# Patient Record
Sex: Female | Born: 1970 | Race: White | Hispanic: No | Marital: Married | Smoking: Former smoker
Health system: Southern US, Community
[De-identification: ages and names within clinical notes are randomized; demographics above are authoritative.]

## PROBLEM LIST (undated history)

## (undated) DIAGNOSIS — G43909 Migraine, unspecified, not intractable, without status migrainosus: Secondary | ICD-10-CM

## (undated) DIAGNOSIS — K219 Gastro-esophageal reflux disease without esophagitis: Secondary | ICD-10-CM

## (undated) DIAGNOSIS — I4891 Unspecified atrial fibrillation: Secondary | ICD-10-CM

## (undated) HISTORY — DX: Unspecified atrial fibrillation: I48.91

## (undated) HISTORY — PX: TONSILLECTOMY: SUR1361

## (undated) HISTORY — PX: CHOLECYSTECTOMY: SHX55

## (undated) HISTORY — PX: SPERMATIC VEIN LIGATION: SHX5144

---

## 2005-10-01 HISTORY — PX: CHOLECYSTECTOMY: SHX55

## 2013-04-30 ENCOUNTER — Encounter (HOSPITAL_BASED_OUTPATIENT_CLINIC_OR_DEPARTMENT_OTHER): Payer: Self-pay | Admitting: *Deleted

## 2013-04-30 ENCOUNTER — Emergency Department (HOSPITAL_BASED_OUTPATIENT_CLINIC_OR_DEPARTMENT_OTHER)
Admission: EM | Admit: 2013-04-30 | Discharge: 2013-04-30 | Disposition: A | Discharge status: Home / Self Care | Payer: BC Managed Care – PPO | Attending: Emergency Medicine | Admitting: Emergency Medicine

## 2013-04-30 DIAGNOSIS — Z880 Allergy status to penicillin: Secondary | ICD-10-CM | POA: Insufficient documentation

## 2013-04-30 DIAGNOSIS — G43909 Migraine, unspecified, not intractable, without status migrainosus: Secondary | ICD-10-CM | POA: Insufficient documentation

## 2013-04-30 DIAGNOSIS — H53149 Visual discomfort, unspecified: Secondary | ICD-10-CM | POA: Insufficient documentation

## 2013-04-30 DIAGNOSIS — Z87891 Personal history of nicotine dependence: Secondary | ICD-10-CM | POA: Insufficient documentation

## 2013-04-30 DIAGNOSIS — Z79899 Other long term (current) drug therapy: Secondary | ICD-10-CM | POA: Insufficient documentation

## 2013-04-30 DIAGNOSIS — R112 Nausea with vomiting, unspecified: Secondary | ICD-10-CM | POA: Insufficient documentation

## 2013-04-30 DIAGNOSIS — K219 Gastro-esophageal reflux disease without esophagitis: Secondary | ICD-10-CM | POA: Insufficient documentation

## 2013-04-30 HISTORY — DX: Gastro-esophageal reflux disease without esophagitis: K21.9

## 2013-04-30 HISTORY — DX: Migraine, unspecified, not intractable, without status migrainosus: G43.909

## 2013-04-30 MED ORDER — SODIUM CHLORIDE 0.9 % IV BOLUS (SEPSIS)
1000.0000 mL | Freq: Once | INTRAVENOUS | Status: AC
  Administered 2013-04-30: 1000 mL via INTRAVENOUS

## 2013-04-30 MED ORDER — DIPHENHYDRAMINE HCL 50 MG/ML IJ SOLN
25.0000 mg | Freq: Once | INTRAMUSCULAR | Status: AC
  Administered 2013-04-30: 25 mg via INTRAVENOUS
  Filled 2013-04-30: qty 1

## 2013-04-30 MED ORDER — METOCLOPRAMIDE HCL 5 MG/ML IJ SOLN: 10.0000 mg | Freq: Once | INTRAMUSCULAR | Status: DC

## 2013-04-30 MED ORDER — METOCLOPRAMIDE HCL 5 MG/ML IJ SOLN
10.0000 mg | Freq: Once | INTRAMUSCULAR | Status: AC
  Administered 2013-04-30: 10 mg via INTRAVENOUS
  Filled 2013-04-30: qty 2

## 2013-04-30 MED ORDER — ONDANSETRON HCL 4 MG/2ML IJ SOLN
4.0000 mg | Freq: Once | INTRAMUSCULAR | Status: AC
  Administered 2013-04-30: 4 mg via INTRAVENOUS
  Filled 2013-04-30: qty 2

## 2013-04-30 MED ORDER — KETOROLAC TROMETHAMINE 30 MG/ML IJ SOLN
30.0000 mg | Freq: Once | INTRAMUSCULAR | Status: AC
  Administered 2013-04-30: 30 mg via INTRAVENOUS
  Filled 2013-04-30: qty 1

## 2013-04-30 MED ORDER — DEXAMETHASONE SODIUM PHOSPHATE 10 MG/ML IJ SOLN
10.0000 mg | Freq: Once | INTRAMUSCULAR | Status: AC
  Administered 2013-04-30: 10 mg via INTRAVENOUS
  Filled 2013-04-30: qty 1

## 2013-04-30 NOTE — ED Provider Notes (Signed)
CSN: [id number]     Arrival date & time 04/30/13  1247 History     First MD Initiated Contact with Patient 04/30/13 1256     Chief Complaint  Patient presents with  . Migraine   (Consider location/radiation/quality/duration/timing/severity/associated sxs/prior Treatment) HPI Comments: Patient is a 42 year old female with a past medical history of migraines who presents with a headache since 0400am. Patient reports a gradual onset and progressive worsening of the headache. The pain is sharp, constant and is located in generalized head without radiation. Patient has tried Imitrex and phenergan for symptoms without relief. No alleviating/aggravating factors. Patient reports associated nausea, vomiting and photophobia. Patient denies fever, diarrhea, numbness/tingling, weakness, visual changes, congestion, chest pain, SOB, abdominal pain.     Patient is a 42 y.o. female presenting with migraines.  Migraine Associated symptoms include headaches, nausea and vomiting.    Past Medical History  Diagnosis Date  . Migraine   . Gastroesophageal reflux    Past Surgical History  Procedure Laterality Date  . Cholecystectomy    . Tonsillectomy    . Spermatic vein ligation     No family history on file. History  Substance Use Topics  . Smoking status: Former Games developer  . Smokeless tobacco: Not on file  . Alcohol Use: No   OB History   Grav Para Term Preterm Abortions TAB SAB Ect Mult Living                 Review of Systems  Gastrointestinal: Positive for nausea and vomiting.  Neurological: Positive for headaches.  All other systems reviewed and are negative.    Allergies  Penicillins  Home Medications   Current Outpatient Rx  Name  Route  Sig  Dispense  Refill  . omeprazole (PRILOSEC) 40 MG capsule   Oral   Take 40 mg by mouth daily.         . SUMAtriptan (IMITREX) 5 MG/ACT nasal spray   Nasal   Place 1 spray into the nose every 2 (two) hours as needed for migraine.           BP 131/64  Pulse 108  Resp 18  Ht 5\' 7"  (1.702 m)  Wt 250 lb (113.399 kg)  BMI 39.15 kg/m2  SpO2 100%  LMP 04/16/2013 Physical Exam  Nursing note and vitals reviewed. Constitutional: She appears well-developed and well-nourished. No distress.  HENT:  Head: Normocephalic and atraumatic.  Eyes: Conjunctivae and EOM are normal.  Neck: Normal range of motion. Neck supple.  No meningeal signs.   Cardiovascular: Normal rate and regular rhythm.  Exam reveals no gallop and no friction rub.   No murmur heard. Pulmonary/Chest: Effort normal and breath sounds normal. She has no wheezes. She has no rales. She exhibits no tenderness.  Abdominal: Soft. She exhibits no distension. There is no tenderness. There is no rebound.  Musculoskeletal: Normal range of motion.  Neurological: She is alert. Coordination normal.  Speech is goal-oriented. Moves limbs without ataxia.   Skin: Skin is warm and dry.  Psychiatric: She has a normal mood and affect. Her behavior is normal.    ED Course   Procedures (including critical care time)  Labs Reviewed - No data to display No results found.  1. Migraine     MDM  1:48 PM Patient will have fluids, toradol, reglan, benadryl. Vitals stable and patient afebrile. No meningeal signs.   4:18 PM Patient reports relief after another dose of benadryl, zofran and decadron. Vitals stable and  patient afebrile. Patient will have a Neurology follow up as needed. Patient instructed to return with worsening or concerning symptoms.   Emilia Beck, PA-C 04/30/13 1623

## 2013-04-30 NOTE — ED Notes (Signed)
Patient states she woke up this morning with a migraine headache at 0400 am.  Has used her Imitrex nasal spray with no relief.  States had nausea and vomiting x 15 times since 0400.  Took a phenergan suppository at 0915 am with no relief.  Hx of migraines and this one feels like her normal migraine.

## 2013-05-01 NOTE — ED Provider Notes (Signed)
  Medical screening examination/treatment/procedure(s) were performed by non-physician practitioner and as supervising physician I was immediately available for consultation/collaboration.    Annie Saephan, MD 05/01/13 1523 

## 2014-09-25 ENCOUNTER — Emergency Department (HOSPITAL_BASED_OUTPATIENT_CLINIC_OR_DEPARTMENT_OTHER)
Admission: EM | Admit: 2014-09-25 | Discharge: 2014-09-25 | Disposition: A | Discharge status: Home / Self Care | Payer: BC Managed Care – PPO | Attending: Emergency Medicine | Admitting: Emergency Medicine

## 2014-09-25 ENCOUNTER — Encounter (HOSPITAL_BASED_OUTPATIENT_CLINIC_OR_DEPARTMENT_OTHER): Payer: Self-pay | Admitting: Emergency Medicine

## 2014-09-25 DIAGNOSIS — Z880 Allergy status to penicillin: Secondary | ICD-10-CM | POA: Insufficient documentation

## 2014-09-25 DIAGNOSIS — R51 Headache: Secondary | ICD-10-CM | POA: Diagnosis present

## 2014-09-25 DIAGNOSIS — G43809 Other migraine, not intractable, without status migrainosus: Secondary | ICD-10-CM | POA: Diagnosis not present

## 2014-09-25 DIAGNOSIS — Z79899 Other long term (current) drug therapy: Secondary | ICD-10-CM | POA: Diagnosis not present

## 2014-09-25 DIAGNOSIS — K219 Gastro-esophageal reflux disease without esophagitis: Secondary | ICD-10-CM | POA: Insufficient documentation

## 2014-09-25 DIAGNOSIS — Z87891 Personal history of nicotine dependence: Secondary | ICD-10-CM | POA: Insufficient documentation

## 2014-09-25 MED ORDER — METOCLOPRAMIDE HCL 5 MG/ML IJ SOLN
10.0000 mg | Freq: Once | INTRAMUSCULAR | Status: AC
  Administered 2014-09-25: 10 mg via INTRAVENOUS
  Filled 2014-09-25: qty 2

## 2014-09-25 MED ORDER — SODIUM CHLORIDE 0.9 % IV BOLUS (SEPSIS)
1000.0000 mL | Freq: Once | INTRAVENOUS | Status: AC
  Administered 2014-09-25: 1000 mL via INTRAVENOUS

## 2014-09-25 MED ORDER — KETOROLAC TROMETHAMINE 30 MG/ML IJ SOLN
30.0000 mg | Freq: Once | INTRAMUSCULAR | Status: AC
  Administered 2014-09-25: 30 mg via INTRAVENOUS
  Filled 2014-09-25: qty 1

## 2014-09-25 MED ORDER — DIPHENHYDRAMINE HCL 50 MG/ML IJ SOLN
25.0000 mg | Freq: Once | INTRAMUSCULAR | Status: AC
  Administered 2014-09-25: 25 mg via INTRAVENOUS
  Filled 2014-09-25: qty 1

## 2014-09-25 MED ORDER — ONDANSETRON 8 MG PO TBDP
8.0000 mg | ORAL_TABLET | Freq: Once | ORAL | Status: AC
  Administered 2014-09-25: 8 mg via ORAL
  Filled 2014-09-25: qty 1

## 2014-09-25 MED ORDER — DEXAMETHASONE SODIUM PHOSPHATE 10 MG/ML IJ SOLN
10.0000 mg | Freq: Once | INTRAMUSCULAR | Status: AC
Start: 2014-09-25 — End: 2014-09-25
  Administered 2014-09-25: 10 mg via INTRAVENOUS
  Filled 2014-09-25: qty 1

## 2014-09-25 NOTE — ED Provider Notes (Signed)
CSN: [id number]     Arrival date & time 09/25/14  1723 History  This chart was scribed for Geoffery Lyonsouglas Kameka Whan, MD by Modena JanskyAlbert Thayil, ED Scribe. This patient was seen in room MH05/MH05 and the patient's care was started at 7:10 PM.   Chief Complaint  Patient presents with  . Migraine  . Emesis   Patient is a 43 y.o. female presenting with migraines and vomiting. The history is provided by the patient. No language interpreter was used.  Migraine This is a chronic problem. The current episode started 6 to 12 hours ago. The problem occurs constantly. The problem has not changed since onset.Associated symptoms include headaches. Nothing aggravates the symptoms. Nothing relieves the symptoms. She has tried nothing for the symptoms.  Emesis Associated symptoms: headaches    HPI Comments: Juanda CrumbleJennifer Feldhaus is a 43 y.o. female with a hx of migraines who presents to the Emergency Department complaining of a constant moderate migraine that started today. She reports that the migraine is right sided and radiates to her neck. She states that she took zomig without any relief. She states that she has been having emesis today.  Past Medical History  Diagnosis Date  . Migraine   . Gastroesophageal reflux    Past Surgical History  Procedure Laterality Date  . Cholecystectomy    . Tonsillectomy    . Spermatic vein ligation     No family history on file. History  Substance Use Topics  . Smoking status: Former Games developermoker  . Smokeless tobacco: Not on file  . Alcohol Use: No   OB History    No data available     Review of Systems  Gastrointestinal: Positive for vomiting.  Neurological: Positive for headaches.  All other systems reviewed and are negative.     Allergies  Penicillins  Home Medications   Prior to Admission medications   Medication Sig Start Date End Date Taking? Authorizing Provider  zolmitriptan (ZOMIG) 5 MG nasal solution Place into the nose as needed for migraine.   Yes Historical  Provider, MD  omeprazole (PRILOSEC) 40 MG capsule Take 40 mg by mouth daily.    Historical Provider, MD  SUMAtriptan (IMITREX) 5 MG/ACT nasal spray Place 1 spray into the nose every 2 (two) hours as needed for migraine.    Historical Provider, MD   BP 121/77 mmHg  Pulse 93  Temp(Src) 98.3 F (36.8 C) (Oral)  Resp 18  Ht 5\' 7"  (1.702 m)  Wt 250 lb (113.399 kg)  BMI 39.15 kg/m2  SpO2 99%  LMP 09/25/2014 Physical Exam  Constitutional: She is oriented to person, place, and time. She appears well-developed and well-nourished. No distress.  HENT:  Head: Normocephalic and atraumatic.  Eyes: EOM are normal. Pupils are equal, round, and reactive to light.  NO papilledema on funduscopic exam  Neck: Normal range of motion. Neck supple. No tracheal deviation present.  No nuchal rigidity.   Cardiovascular: Normal rate, regular rhythm and normal heart sounds.  Exam reveals no gallop and no friction rub.   No murmur heard. Pulmonary/Chest: Effort normal. No respiratory distress. She has no wheezes. She has no rales.  Musculoskeletal: Normal range of motion.  Neurological: She is alert and oriented to person, place, and time. No cranial nerve deficit. She exhibits normal muscle tone. Coordination normal.  Skin: Skin is warm and dry.  Psychiatric: She has a normal mood and affect. Her behavior is normal.  Nursing note and vitals reviewed.   ED Course  Procedures (including critical  care time) DIAGNOSTIC STUDIES: Oxygen Saturation is 99% on RA, normal by my interpretation.    COORDINATION OF CARE: 7:14 PM- Pt advised of plan for treatment which includes medication and pt agrees.  Labs Review Labs Reviewed - No data to display  Imaging Review No results found.   EKG Interpretation None      MDM   Final diagnoses:  None    Patient with history of migraine headaches. She presents today with complaints of headache that is similar to prior migraines, however is not relieved with  home medications. She is given IV fluids and a migraine cocktail and is now feeling significantly improved. She will be discharged to home, to return as needed for any problems.  I personally performed the services described in this documentation, which was scribed in my presence. The recorded information has been reviewed and is accurate.       Geoffery Lyonsouglas Cadey Bazile, MD 09/26/14 1800

## 2014-09-25 NOTE — ED Notes (Addendum)
Pt presents to ED with complaints of migraine and vomiting. PT took migraine meds at home without relief. PT vomiting in triage.

## 2014-09-25 NOTE — Discharge Instructions (Signed)

## 2016-06-07 ENCOUNTER — Other Ambulatory Visit: Payer: Self-pay | Admitting: *Deleted

## 2016-06-07 NOTE — Telephone Encounter (Signed)
Opened in error

## 2018-07-23 ENCOUNTER — Other Ambulatory Visit: Payer: Self-pay | Admitting: Family Medicine

## 2018-07-23 DIAGNOSIS — E041 Nontoxic single thyroid nodule: Secondary | ICD-10-CM

## 2018-08-01 ENCOUNTER — Ambulatory Visit
Admission: RE | Admit: 2018-08-01 | Discharge: 2018-08-01 | Disposition: A | Discharge status: Home / Self Care | Payer: Self-pay | Source: Ambulatory Visit | Attending: Family Medicine | Admitting: Family Medicine

## 2018-08-01 DIAGNOSIS — E041 Nontoxic single thyroid nodule: Secondary | ICD-10-CM

## 2019-02-06 IMAGING — US US THYROID
1 series · 13 of 25 positions shown · non-contrast
Comparison: None.

CLINICAL DATA: Palpable abnormality.  Thyroid nodule.

EXAM:
THYROID ULTRASOUND
TECHNIQUE: Ultrasound examination of the thyroid gland and adjacent soft
tissues was performed.

[Series 1: us thyroid · 0.06mm/px · 13 of 45 slices shown]
[im 1/45]
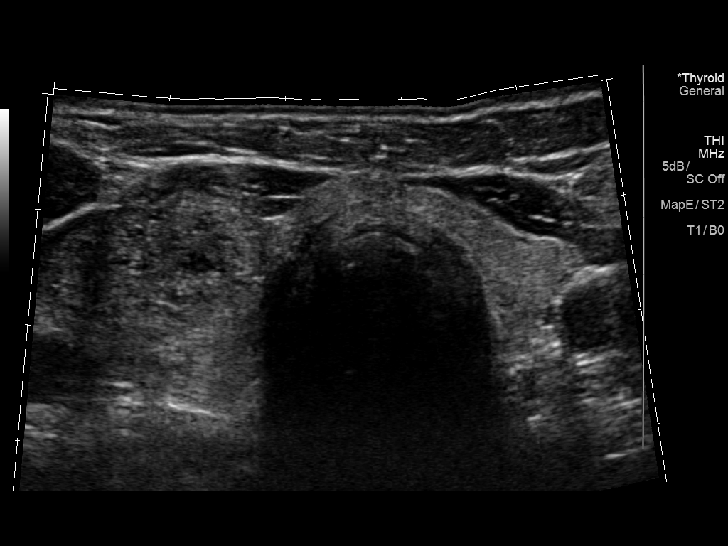
[im 4/45]
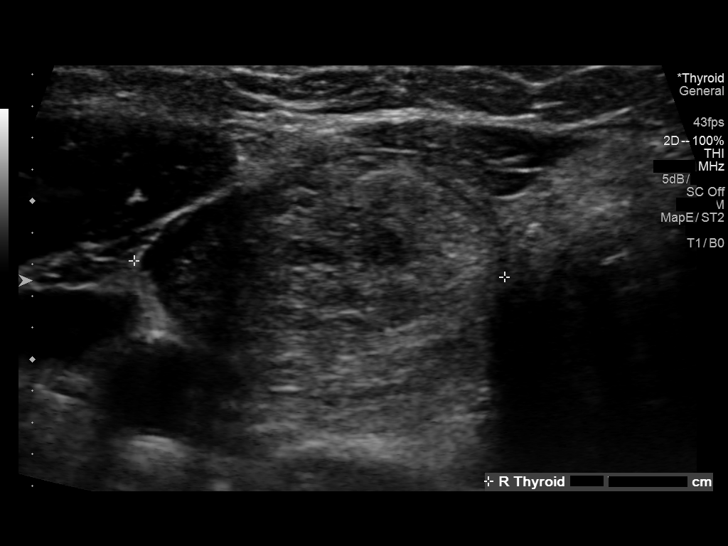
[im 8/45]
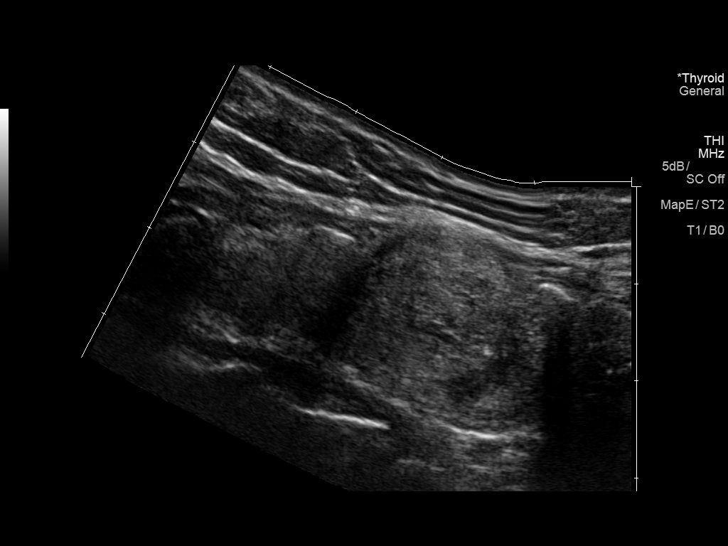
[im 12/45]
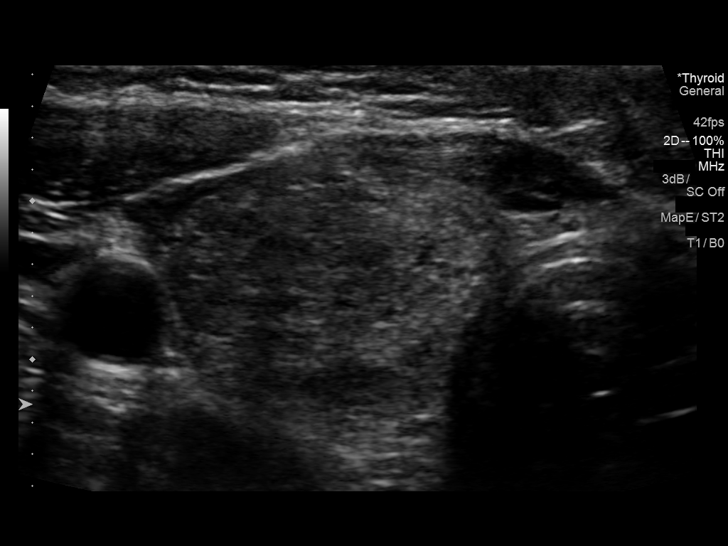
[im 15/45]
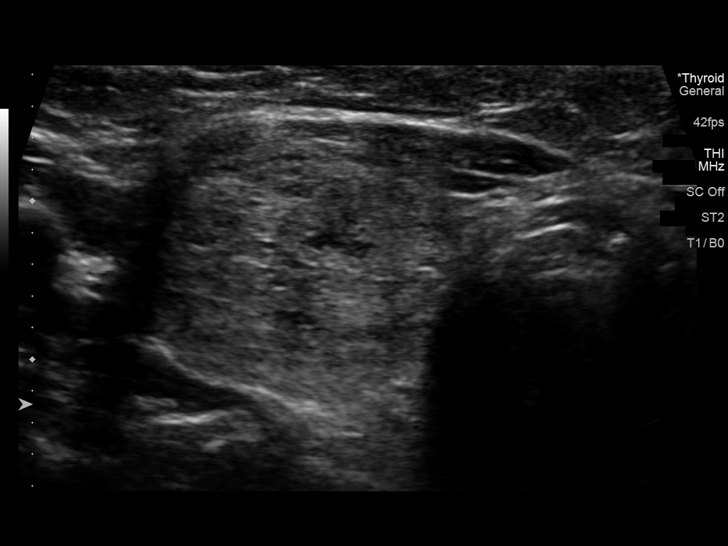
[im 19/45]
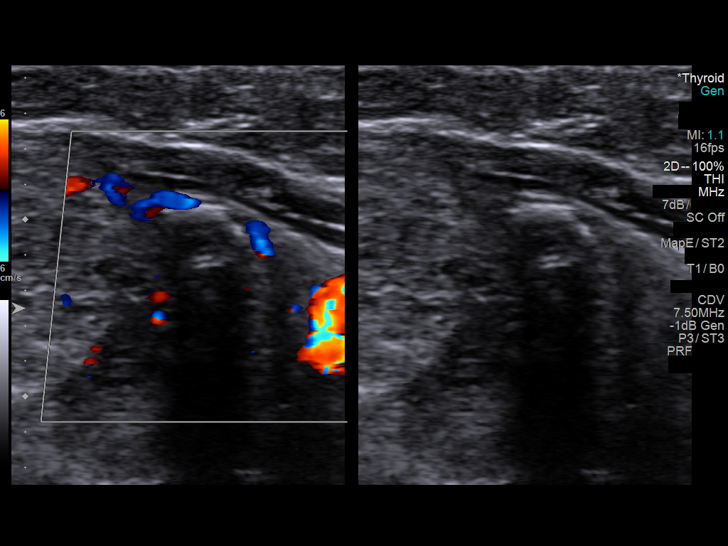
[im 23/45]
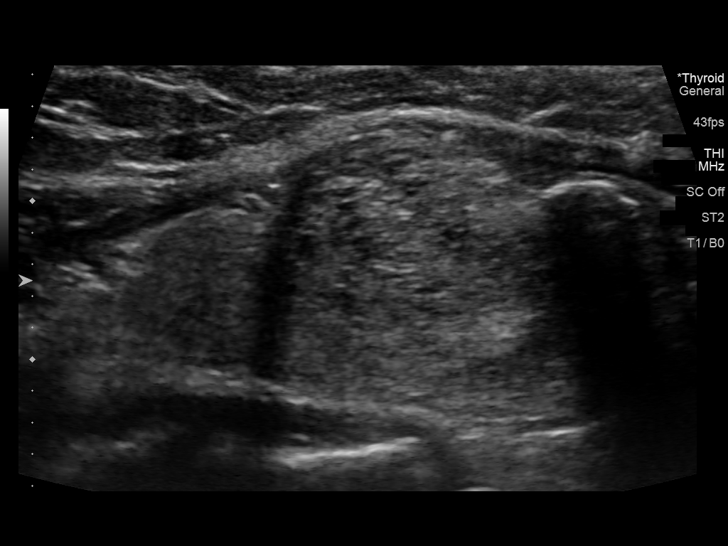
[im 26/45]
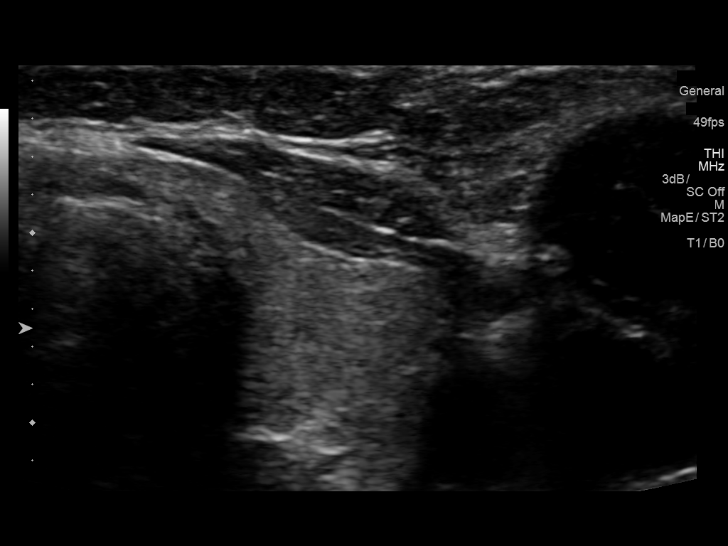
[im 30/45]
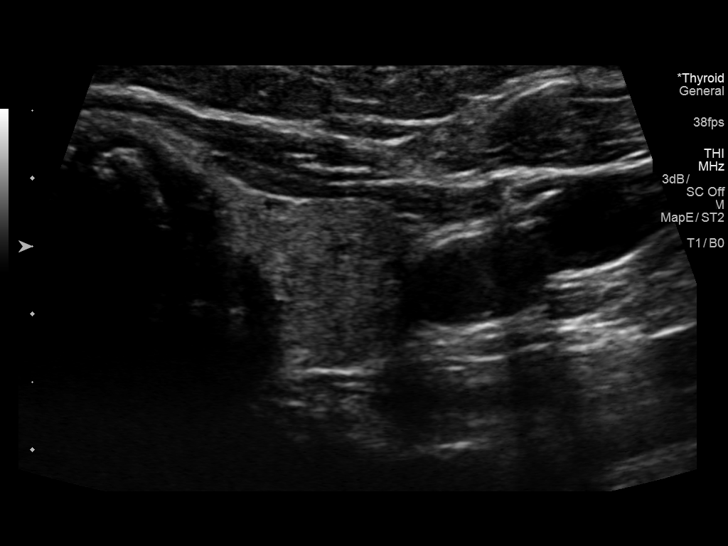
[im 34/45]
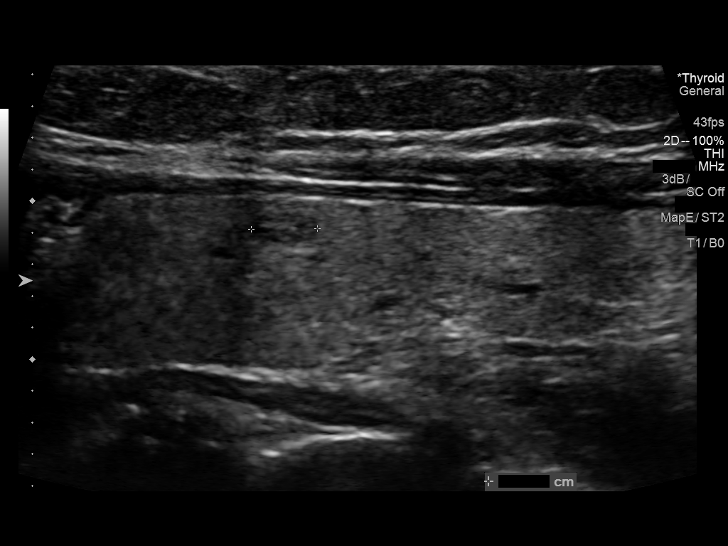
[im 37/45]
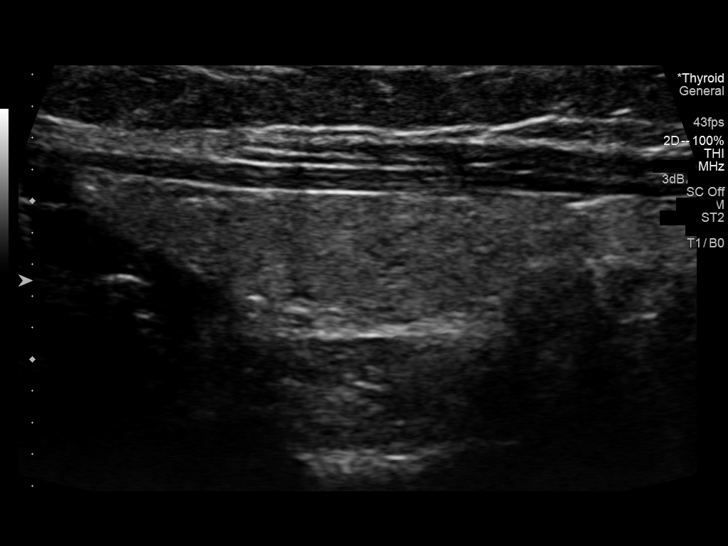
[im 41/45]
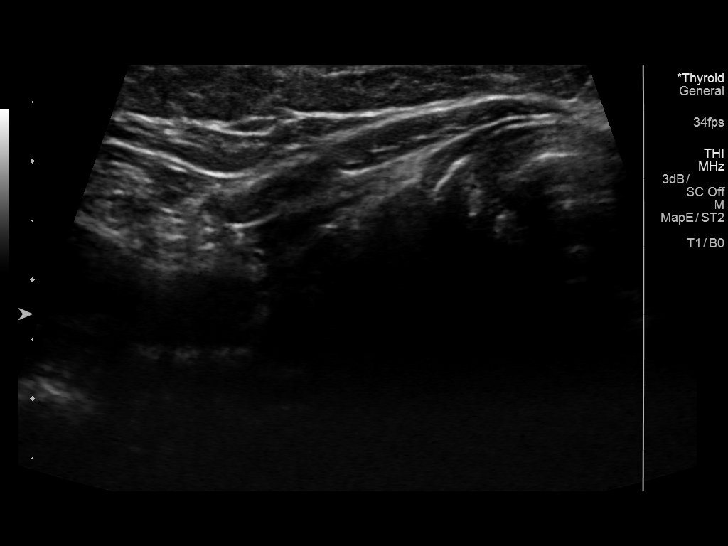
[im 45/45]
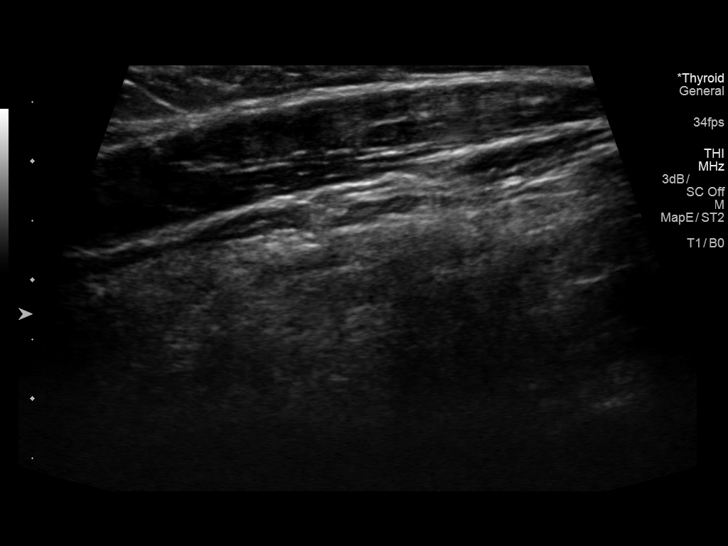

[13 of 25 positions shown; findings below may reference images not displayed]

FINDINGS: Parenchymal Echotexture: Normal

Isthmus: 0.2 cm

Right lobe: 4.6 x 2.0 x 2.3 cm

Left lobe: 4.3 x 1.3 x 1.0 cm

_________________________________________________________

Estimated total number of nodules >/= 1 cm: 1

Number of spongiform nodules >/=  2 cm not described below (TR1): 0

Number of mixed cystic and solid nodules >/= 1.5 cm not described
below (TR2): 0

_________________________________________________________

Nodule # 1:

Location: Right; Mid

Maximum size: 2.3 cm; Other 2 dimensions: 2.1 x 1.6 cm

Composition: solid/almost completely solid (2)

Echogenicity: isoechoic (1)

Shape: not taller-than-wide (0)

Margins: smooth (0)

Echogenic foci: none (0)

ACR TI-RADS total points: 3.

ACR TI-RADS risk category: TR3 (3 points).

ACR TI-RADS recommendations:

*Given size (>/= 1.5 - 2.4 cm) and appearance, a follow-up
ultrasound in 1 year should be considered based on TI-RADS criteria.

_________________________________________________________

Nodule # 2:

Location: Right; Inferior

Maximum size: 0.9 cm; Other 2 dimensions: 0.8 x 0.7 cm

Composition: solid/almost completely solid (2)

Echogenicity: isoechoic (1)

Shape: not taller-than-wide (0)

Margins: smooth (0)

Echogenic foci: peripheral calcifications (2)

ACR TI-RADS total points: 5.

ACR TI-RADS risk category: TR4 (4-6 points).

ACR TI-RADS recommendations:

Given size (<0.9 cm) and appearance, this nodule does NOT meet
TI-RADS criteria for biopsy or dedicated follow-up.

_________________________________________________________
IMPRESSION: Right nodule 1 meets criteria for annual follow-up.

The above is in keeping with the ACR TI-RADS recommendations - [HOSPITAL] 8333;[DATE].

## 2019-03-18 ENCOUNTER — Other Ambulatory Visit: Payer: Self-pay | Admitting: Internal Medicine

## 2019-03-18 DIAGNOSIS — E041 Nontoxic single thyroid nodule: Secondary | ICD-10-CM

## 2019-04-09 ENCOUNTER — Ambulatory Visit
Admission: RE | Admit: 2019-04-09 | Discharge: 2019-04-09 | Disposition: A | Discharge status: Home / Self Care | Payer: Self-pay | Source: Ambulatory Visit | Attending: Internal Medicine | Admitting: Internal Medicine

## 2019-04-09 DIAGNOSIS — E041 Nontoxic single thyroid nodule: Secondary | ICD-10-CM

## 2019-06-26 LAB — HM COLONOSCOPY

## 2020-03-18 ENCOUNTER — Other Ambulatory Visit: Payer: Self-pay | Admitting: Internal Medicine

## 2020-03-18 DIAGNOSIS — E041 Nontoxic single thyroid nodule: Secondary | ICD-10-CM

## 2020-03-22 ENCOUNTER — Ambulatory Visit
Admission: RE | Admit: 2020-03-22 | Discharge: 2020-03-22 | Disposition: A | Discharge status: Home / Self Care | Payer: BC Managed Care – PPO | Source: Ambulatory Visit | Attending: Internal Medicine | Admitting: Internal Medicine

## 2020-03-22 DIAGNOSIS — E041 Nontoxic single thyroid nodule: Secondary | ICD-10-CM

## 2021-04-15 IMAGING — US US THYROID
1 series · 13 of 25 positions shown · non-contrast
Comparison: 08/01/2018

CLINICAL DATA: Prior ultrasound follow-up. Follow-up thyroid nodule

EXAM:
THYROID ULTRASOUND
TECHNIQUE: Ultrasound examination of the thyroid gland and adjacent soft
tissues was performed.

[Series 1: us thyroid · 0.05mm/px · 13 of 41 slices shown]
[im 1/41]
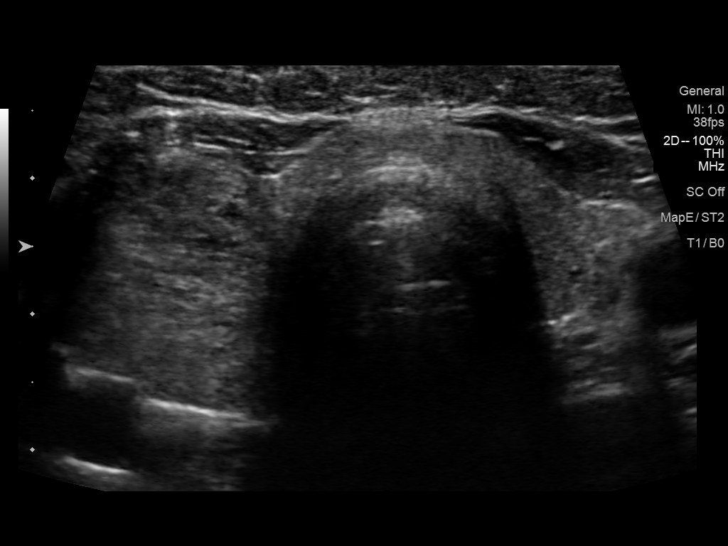
[im 4/41]
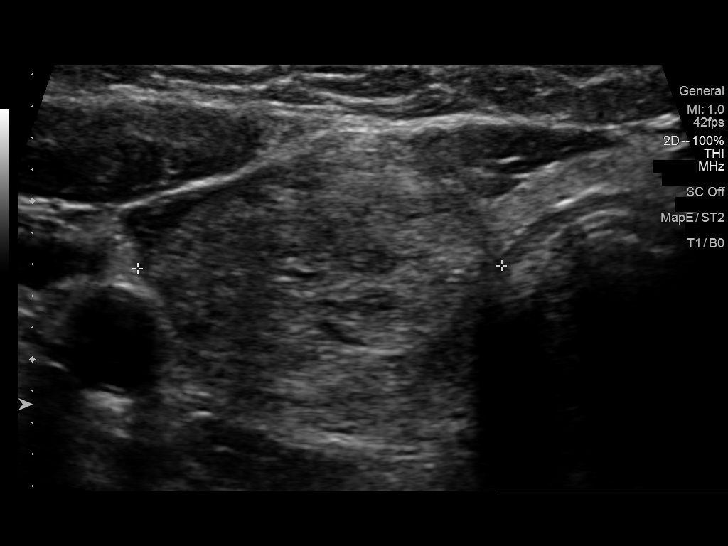
[im 7/41]
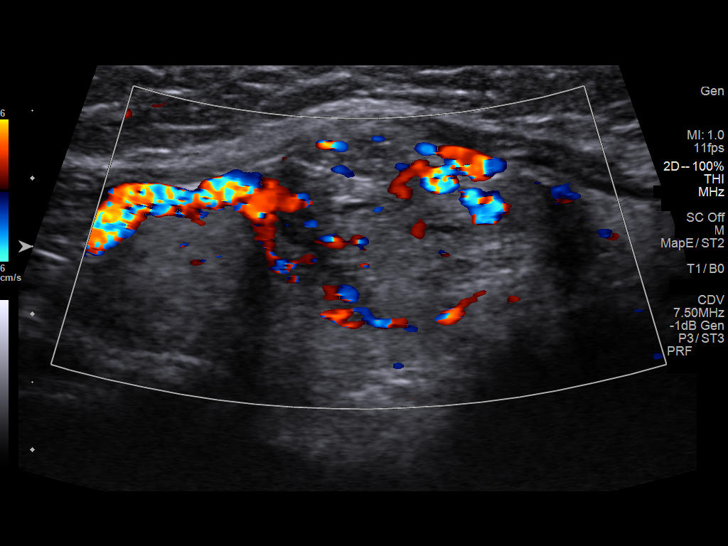
[im 11/41]
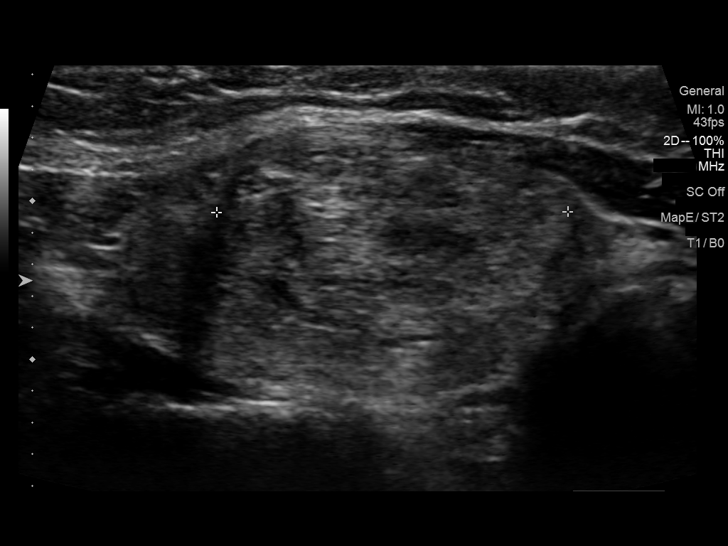
[im 14/41]
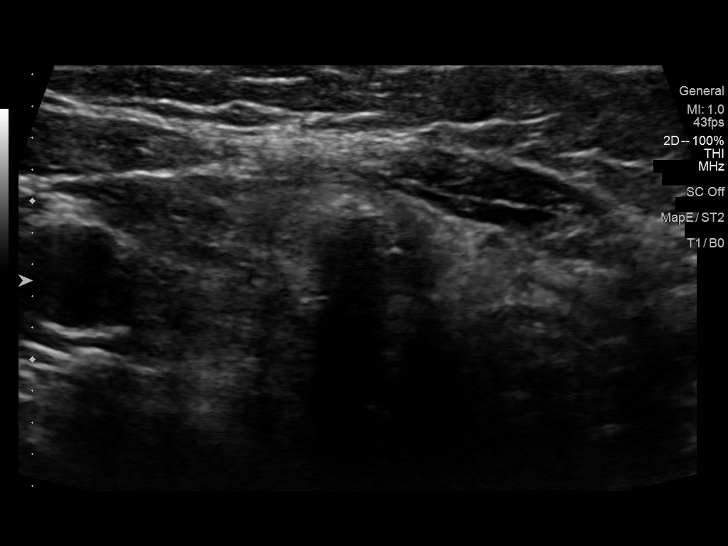
[im 17/41]
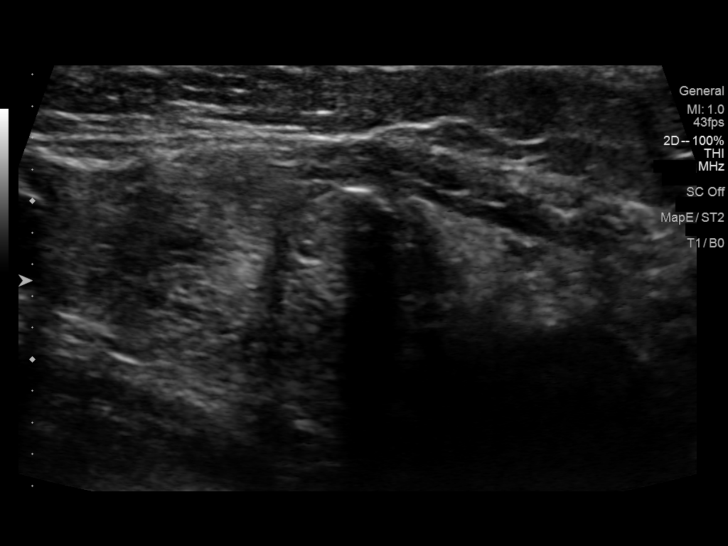
[im 21/41]
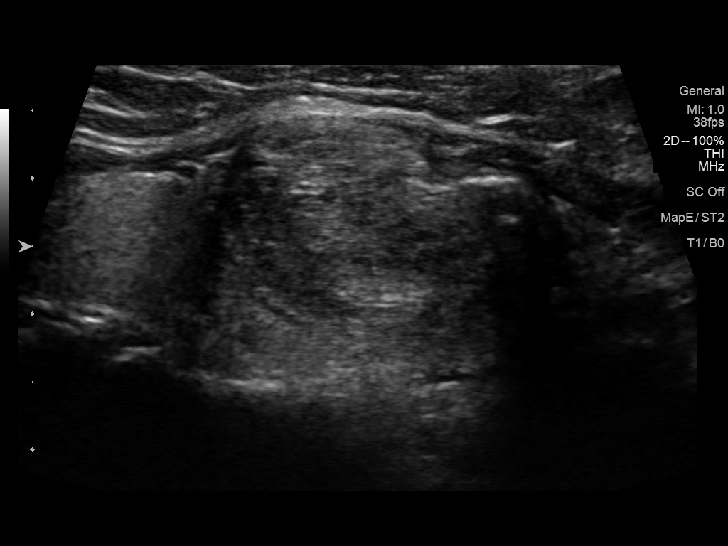
[im 24/41]
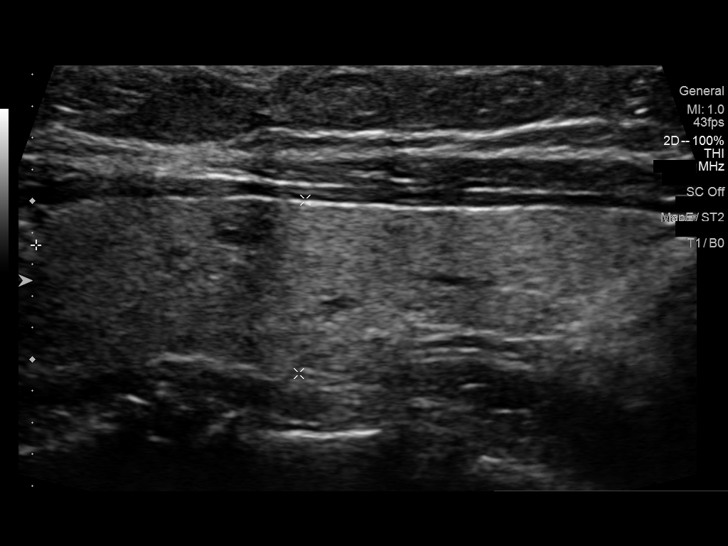
[im 27/41]
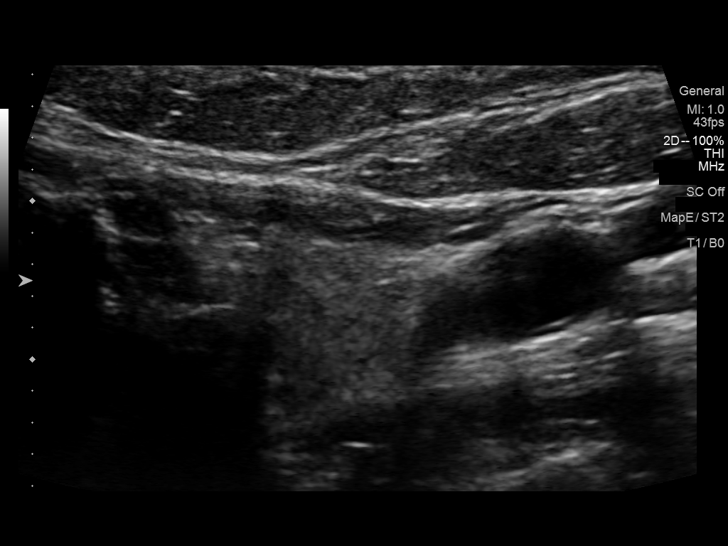
[im 31/41]
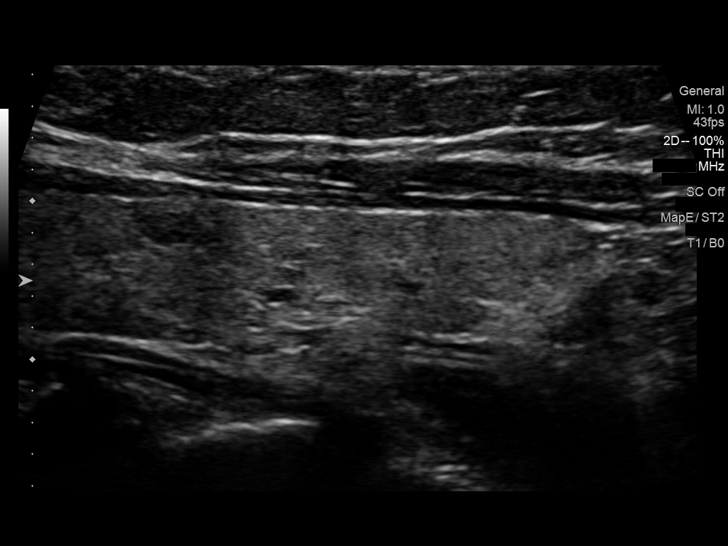
[im 34/41]
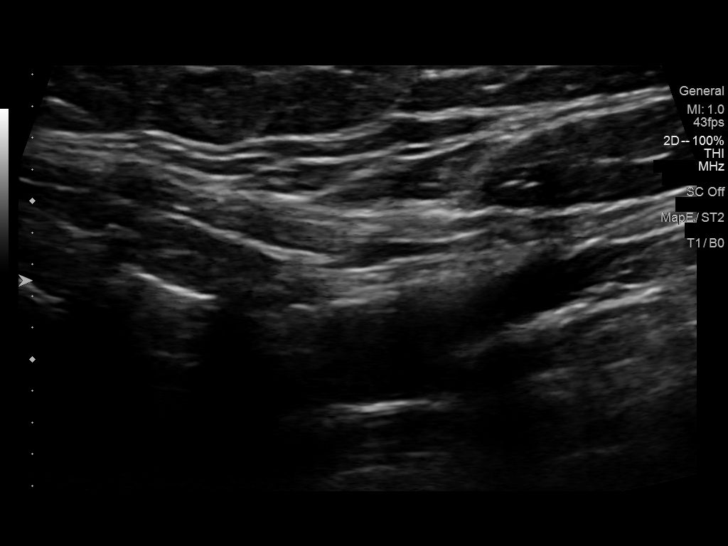
[im 37/41]
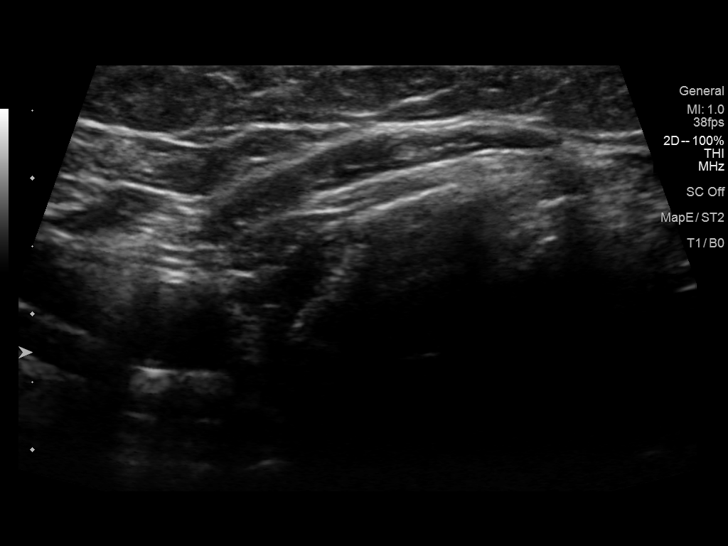
[im 41/41]
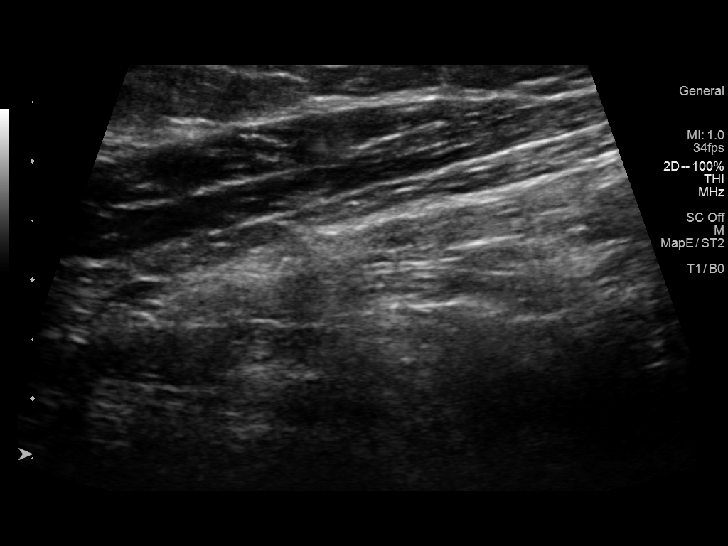

[13 of 25 positions shown; findings below may reference images not displayed]

FINDINGS: Parenchymal Echotexture: Mildly heterogenous

Isthmus: Normal in size measures 0.3 cm in diameter, unchanged

Right lobe: Normal in size measuring 4.6 x 2.0 x 2.3 cm, unchanged,
previously, 4.6 x 2.0 x 2.3 cm

Left lobe: Normal in size measuring 4.1 x 1.1 x 1.0 cm, unchanged,
previously, 4.3 x 1.3 x 1.0 cm

_________________________________________________________

Estimated total number of nodules >/= 1 cm: 1

Number of spongiform nodules >/=  2 cm not described below (TR1): 0

Number of mixed cystic and solid nodules >/= 1.5 cm not described
below (TR2): 0

_________________________________________________________

Nodule # 1:

Prior biopsy: No

Location: Right; Mid

Maximum size: 2.2 cm; Other 2 dimensions: 2.1 x 1.7 cm, previously,
2.3 x 2.1 x 1.6 cm

Composition: solid/almost completely solid (2)

Echogenicity: isoechoic (1)

Shape: not taller-than-wide (0)

Margins: ill-defined (0)

Echogenic foci: none (0)

ACR TI-RADS total points: 3.

ACR TI-RADS risk category:  TR3 (3 points).

Significant change in size (>/= 20% in two dimensions and minimal
increase of 2 mm): No

Change in features: No

Change in ACR TI-RADS risk category: No

ACR TI-RADS recommendations:

*Given size (>/= 1.5 - 2.4 cm) and appearance, a follow-up
ultrasound in 1 year should be considered based on TI-RADS criteria.

_________________________________________________________

Nodule # 2:

Prior biopsy: No

Location: Right; Inferior

Maximum size: 0.9 cm; Other 2 dimensions: 0.9 x 0.8 cm, previously,
0.9 x 0.8 x 0.7 cm

Composition: cannot determine (2)

Echogenicity: cannot determine (1)

Shape: not taller-than-wide (0)

Margins: ill-defined (0)

Echogenic foci: peripheral calcifications (2)

ACR TI-RADS total points: 5.

ACR TI-RADS risk category:  TR4 (4-6 points).

Significant change in size (>/= 20% in two dimensions and minimal
increase of 2 mm): No

Change in features: No

Change in ACR TI-RADS risk category: No

ACR TI-RADS recommendations:

Given size (<0.9 cm) and appearance, this nodule does NOT meet
TI-RADS criteria for biopsy or dedicated follow-up.

_________________________________________________________
IMPRESSION: 1. Similar findings of multinodular goiter. No new or enlarging
thyroid nodules.
2. Nodule #1 within the right lobe of the thyroid is unchanged
compared to the [DATE] examination though again meets imaging
criteria to recommend 1 year follow-up as clinically indicated.
Annual surveillance until [DATE] would ensure 5 years of stability
and thus a benign etiology.

The above is in keeping with the ACR TI-RADS recommendations - [HOSPITAL] 1937;[DATE].

## 2021-09-21 ENCOUNTER — Telehealth: Payer: Self-pay | Admitting: Cardiovascular Disease

## 2021-09-21 ENCOUNTER — Encounter (HOSPITAL_BASED_OUTPATIENT_CLINIC_OR_DEPARTMENT_OTHER): Payer: Self-pay | Admitting: *Deleted

## 2021-09-21 ENCOUNTER — Emergency Department (HOSPITAL_BASED_OUTPATIENT_CLINIC_OR_DEPARTMENT_OTHER)
Admission: EM | Admit: 2021-09-21 | Discharge: 2021-09-21 | Disposition: A | Discharge status: Home / Self Care | Payer: 59 | Attending: Emergency Medicine | Admitting: Emergency Medicine

## 2021-09-21 ENCOUNTER — Emergency Department (HOSPITAL_BASED_OUTPATIENT_CLINIC_OR_DEPARTMENT_OTHER): Payer: 59

## 2021-09-21 ENCOUNTER — Other Ambulatory Visit: Payer: Self-pay

## 2021-09-21 DIAGNOSIS — I4892 Unspecified atrial flutter: Secondary | ICD-10-CM | POA: Diagnosis not present

## 2021-09-21 DIAGNOSIS — Z87891 Personal history of nicotine dependence: Secondary | ICD-10-CM | POA: Diagnosis not present

## 2021-09-21 DIAGNOSIS — R002 Palpitations: Secondary | ICD-10-CM | POA: Diagnosis present

## 2021-09-21 DIAGNOSIS — I4891 Unspecified atrial fibrillation: Secondary | ICD-10-CM

## 2021-09-21 LAB — CBC WITH DIFFERENTIAL/PLATELET
Abs Immature Granulocytes: 0.04 10*3/uL (ref 0.00–0.07)
Basophils Absolute: 0.1 10*3/uL (ref 0.0–0.1)
Basophils Relative: 1 %
Eosinophils Absolute: 0.3 10*3/uL (ref 0.0–0.5)
Eosinophils Relative: 3 %
HCT: 38.1 % (ref 36.0–46.0)
Hemoglobin: 12.3 g/dL (ref 12.0–15.0)
Immature Granulocytes: 0 %
Lymphocytes Relative: 29 %
Lymphs Abs: 3.1 10*3/uL (ref 0.7–4.0)
MCH: 27.2 pg (ref 26.0–34.0)
MCHC: 32.3 g/dL (ref 30.0–36.0)
MCV: 84.1 fL (ref 80.0–100.0)
Monocytes Absolute: 1.3 10*3/uL — ABNORMAL HIGH (ref 0.1–1.0)
Monocytes Relative: 11 %
Neutro Abs: 6.2 10*3/uL (ref 1.7–7.7)
Neutrophils Relative %: 56 %
Platelets: 448 10*3/uL — ABNORMAL HIGH (ref 150–400)
RBC: 4.53 MIL/uL (ref 3.87–5.11)
RDW: 16.5 % — ABNORMAL HIGH (ref 11.5–15.5)
WBC: 10.9 10*3/uL — ABNORMAL HIGH (ref 4.0–10.5)
nRBC: 0 % (ref 0.0–0.2)

## 2021-09-21 LAB — COMPREHENSIVE METABOLIC PANEL
ALT: 20 U/L (ref 0–44)
AST: 22 U/L (ref 15–41)
Albumin: 3.9 g/dL (ref 3.5–5.0)
Alkaline Phosphatase: 71 U/L (ref 38–126)
Anion gap: 8 (ref 5–15)
BUN: 16 mg/dL (ref 6–20)
CO2: 26 mmol/L (ref 22–32)
Calcium: 9.1 mg/dL (ref 8.9–10.3)
Chloride: 103 mmol/L (ref 98–111)
Creatinine, Ser: 0.62 mg/dL (ref 0.44–1.00)
GFR, Estimated: >60 mL/min (ref >60)
Glucose, Bld: 110 mg/dL — ABNORMAL HIGH (ref 70–99)
Potassium: 3.8 mmol/L (ref 3.5–5.1)
Sodium: 137 mmol/L (ref 135–145)
Total Bilirubin: 0.1 mg/dL — ABNORMAL LOW (ref 0.3–1.2)
Total Protein: 7.7 g/dL (ref 6.5–8.1)

## 2021-09-21 LAB — TROPONIN I (HIGH SENSITIVITY): Troponin I (High Sensitivity): 2 ng/L (ref <18)

## 2021-09-21 MED ORDER — DILTIAZEM LOAD VIA INFUSION
10.0000 mg | Freq: Once | INTRAVENOUS | Status: AC
  Administered 2021-09-21: 01:00:00 10 mg via INTRAVENOUS
  Filled 2021-09-21: qty 10

## 2021-09-21 MED ORDER — PROPOFOL 10 MG/ML IV BOLUS
0.5000 mg/kg | Freq: Once | INTRAVENOUS | Status: AC
  Administered 2021-09-21: 04:00:00 60.1 mg via INTRAVENOUS
  Filled 2021-09-21: qty 20

## 2021-09-21 MED ORDER — DILTIAZEM HCL-DEXTROSE 125-5 MG/125ML-% IV SOLN (PREMIX)
5.0000 mg/h | INTRAVENOUS | Status: DC
  Administered 2021-09-21: 01:00:00 5 mg/h via INTRAVENOUS
  Filled 2021-09-21: qty 125

## 2021-09-21 MED ORDER — PROPOFOL 10 MG/ML IV BOLUS
INTRAVENOUS | Status: AC | PRN
  Administered 2021-09-21: 30 mg via INTRAVENOUS
  Administered 2021-09-21 (×3): 40 mg via INTRAVENOUS

## 2021-09-21 NOTE — Telephone Encounter (Signed)
Patient was in the emergency room for new onset A-Fib I scheduled her for new patient appt on 1/23, as that was the first available I could find.  She would like to be seen sooner as the ER told her to be seen by [place] next week.

## 2021-09-21 NOTE — ED Provider Notes (Signed)
[facility] EMERGENCY DEPARTMENT Provider Note   CSN: [id number] Arrival date & time: 09/21/21  0011     History Chief Complaint  Patient presents with   Palpitations    Angel Durham is a 50 y.o. female.  Patient is a 50 year old female with history of GERD and migraines.  She presents today for evaluation of palpitations and weakness.  This started approximately 30 minutes prior to presentation.  Patient was asleep at the time, then woke feeling lightheaded and heart racing.  She attempted to walk around the house to get it to go away, however did not.  She denies having any chest pain.  She denies any excessive caffeine or alcohol intake.  She denies any cardiac history.  The history is provided by the patient.  Palpitations Palpitations quality:  Irregular Onset quality:  Sudden Duration:  30 minutes Timing:  Constant Progression:  Unchanged Chronicity:  New Relieved by:  Nothing Worsened by:  Nothing Ineffective treatments:  None tried     Past Medical History:  Diagnosis Date   Gastroesophageal reflux    Migraine     There are no problems to display for this patient.   Past Surgical History:  Procedure Laterality Date   CHOLECYSTECTOMY     SPERMATIC VEIN LIGATION     TONSILLECTOMY       OB History   No obstetric history on file.     No family history on file.  Social History   Tobacco Use   Smoking status: Former  Substance Use Topics   Alcohol use: No   Drug use: No    Home Medications Prior to Admission medications   Medication Sig Start Date End Date Taking? Authorizing Provider  omeprazole (PRILOSEC) 40 MG capsule Take 40 mg by mouth daily.    [provider]  SUMAtriptan (IMITREX) 5 MG/ACT nasal spray Place 1 spray into the nose every 2 (two) hours as needed for migraine.    [provider]  zolmitriptan (ZOMIG) 5 MG nasal solution Place into the nose as needed for migraine.    [provider]     Allergies    Penicillins  Review of Systems   Review of Systems  Cardiovascular:  Positive for palpitations.   Physical Exam Updated Vital Signs BP 126/78    Pulse (!) 113    Temp 98.3 F (36.8 C)    Resp 16    Ht 5\' 6"  (1.676 m)    Wt 120.2 kg    SpO2 100%    BMI 42.77 kg/m   Physical Exam Vitals and nursing note reviewed.  Constitutional:      General: She is not in acute distress.    Appearance: She is well-developed. She is not diaphoretic.  HENT:     Head: Normocephalic and atraumatic.  Cardiovascular:     Rate and Rhythm: Tachycardia present. Rhythm irregular.     Heart sounds: No murmur heard.   No friction rub. No gallop.  Pulmonary:     Effort: Pulmonary effort is normal. No respiratory distress.     Breath sounds: Normal breath sounds. No wheezing.  Abdominal:     General: Bowel sounds are normal. There is no distension.     Palpations: Abdomen is soft.     Tenderness: There is no abdominal tenderness.  Musculoskeletal:        General: Normal range of motion.     Cervical back: Normal range of motion and neck supple.  Skin:  General: Skin is warm and dry.  Neurological:     General: No focal deficit present.     Mental Status: She is alert and oriented to person, place, and time.     Cranial Nerves: No cranial nerve deficit.    ED Results / Procedures / Treatments   Labs (all labs ordered are listed, but only abnormal results are displayed) Labs Reviewed  CBC WITH DIFFERENTIAL/PLATELET  COMPREHENSIVE METABOLIC PANEL    EKG EKG Interpretation  Date/Time:  Thursday September 21 2021 00:25:49 EST Ventricular Rate:  116 PR Interval:    QRS Duration: 100 QT Interval:  342 QTC Calculation: 476 R Axis:   99 Text Interpretation: Atrial fibrillation Borderline right axis deviation Confirmed by Geoffery Lyons ([id number]) on 09/21/2021 12:34:45 AM  Radiology No results found.  Procedures .Cardioversion  Date/Time: 09/21/2021 4:15 AM Performed by:  Geoffery Lyons, MD Authorized by: Geoffery Lyons, MD   Consent:    Consent obtained:  Verbal and written   Consent given by:  Patient   Alternatives discussed:  No treatment, rate-control medication and anti-coagulation medication Pre-procedure details:    Cardioversion basis:  Emergent   Rhythm:  Atrial fibrillation   Electrode placement:  Anterior-posterior Patient sedated: Yes. Refer to sedation procedure documentation for details of sedation.  Attempt one:    Cardioversion mode:  Synchronous   Waveform:  Biphasic   Shock (Joules):  120   Shock outcome:  Conversion to normal sinus rhythm Post-procedure details:    Patient status:  Alert   Patient tolerance of procedure:  Tolerated well, no immediate complications .Sedation  Date/Time: 09/21/2021 4:16 AM Performed by: Geoffery Lyons, MD Authorized by: Geoffery Lyons, MD   Consent:    Consent obtained:  Verbal and written   Consent given by:  Patient   Risks discussed:  Inadequate sedation, prolonged sedation necessitating reversal and allergic reaction Universal protocol:    Procedure explained and questions answered to patient or proxy's satisfaction: yes     Relevant documents present and verified: yes     Test results available: yes     Imaging studies available: yes     Required blood products, implants, devices, and special equipment available: yes     Immediately prior to procedure, a time out was called: yes     Patient identity confirmed:  Anonymous protocol, patient vented/unresponsive, arm band and verbally with patient Indications:    Procedure performed:  Cardioversion   Procedure necessitating sedation performed by:  Physician performing sedation Pre-sedation assessment:    Time since last food or drink:  6 hours   ASA classification: class 1 - normal, healthy patient     Mallampati score:  I - soft palate, uvula, fauces, pillars visible   Pre-sedation assessments completed and reviewed: airway patency, mental  status and respiratory function   Immediate pre-procedure details:    Reassessment: Patient reassessed immediately prior to procedure     Reviewed: vital signs and relevant labs/tests     Verified: bag valve mask available, emergency equipment available, intubation equipment available, IV patency confirmed, oxygen available and reversal medications available   Procedure details (see MAR for exact dosages):    Preoxygenation:  Nasal cannula   Sedation:  Propofol   Intended level of sedation: deep and moderate (conscious sedation)   Analgesia:  None   Intra-procedure monitoring:  Blood pressure monitoring, continuous capnometry, frequent LOC assessments, frequent vital sign checks, continuous pulse oximetry and cardiac monitor   Intra-procedure events: none  Total Provider sedation time (minutes):  10 Post-procedure details:    Post-sedation assessment completed:  09/21/2021 4:17 AM   Attendance: Constant attendance by certified staff until patient recovered     Recovery: Patient returned to pre-procedure baseline     Patient is stable for discharge or admission: yes     Procedure completion:  Tolerated well, no immediate complications   Medications Ordered in ED Medications  diltiazem (CARDIZEM) 1 mg/mL load via infusion 10 mg (has no administration in time range)    And  diltiazem (CARDIZEM) 125 mg in dextrose 5% 125 mL (1 mg/mL) infusion (has no administration in time range)    ED Course  I have reviewed the triage vital signs and the nursing notes.  Pertinent labs & imaging results that were available during my care of the patient were reviewed by me and considered in my medical decision making (see chart for details).    MDM Rules/Calculators/A&P  Patient is a 50 year old female with history of GERD and migraines.  She presents today for evaluation of palpitations and weakness.  The sensation woke her from sleep just prior to presenting here.  She arrived here in atrial  fibrillation that had only been ongoing for less than an hour.  She was started on a Cardizem drip with some improvement in her rate.  Laboratory studies have all returned and are basically unremarkable.  Her vital signs are otherwise stable.  At this point, I placed a consult to cardiology.  I spoke with Dr. Daphine Deutscher who was the fellow on-call.  As the patient can ascertain the exact time that she went into this rhythm, Dr. Daphine Deutscher felt as though proceeding with cardioversion was appropriate.  Patient underwent conscious sedation using propofol and was successfully cardioverted to sinus rhythm as per procedure note below.  She has been observed after cardioversion and appears to be doing well.  At this point, I feel as though she can be safely discharged.  She will be referred to cardiology as an outpatient.  CRITICAL CARE Performed by: Geoffery Lyons Total critical care time: 60 minutes Critical care time was exclusive of separately billable procedures and treating other patients. Critical care was necessary to treat or prevent imminent or life-threatening deterioration. Critical care was time spent personally by me on the following activities: development of treatment plan with patient and/or surrogate as well as nursing, discussions with consultants, evaluation of patient's response to treatment, examination of patient, obtaining history from patient or surrogate, ordering and performing treatments and interventions, ordering and review of laboratory studies, ordering and review of radiographic studies, pulse oximetry and re-evaluation of patient's condition.   Final Clinical Impression(s) / ED Diagnoses Final diagnoses:  None    Rx / DC Orders ED Discharge Orders     None        Geoffery Lyons, MD 09/21/21 [phone]

## 2021-09-21 NOTE — ED Notes (Signed)
Patient and visitor made aware of plan of care.  All questions answered

## 2021-09-21 NOTE — Sedation Documentation (Signed)
Pt converted

## 2021-09-21 NOTE — ED Triage Notes (Addendum)
C/o palpitations, lightheaded  x 30 mins , denies SOB , CP

## 2021-09-21 NOTE — Telephone Encounter (Signed)
Returned call to patient and offered the afib clinic for Tuesday 09/26/21 at 10:30 am with Rudi Coco, NP. Patient given the code for parking and will arrive at 10:15 am.  She was appreciative of the call to move her appointment up.

## 2021-09-21 NOTE — Discharge Instructions (Signed)
Rest.  Follow-up with cardiology in the next 4 to 5 days.  The contact information for the [facility] health cardiology clinic on [facility] has been provided in this discharge summary for you to call and make these arrangements.  If you have not heard from them by Friday morning, call to make these arrangements.  Return to the emergency department in the meantime if you develop chest pain, shortness of breath, recurrent palpitations, or other new and concerning symptoms.

## 2021-09-26 ENCOUNTER — Ambulatory Visit (HOSPITAL_COMMUNITY)
Admission: RE | Admit: 2021-09-26 | Discharge: 2021-09-26 | Disposition: A | Discharge status: Home / Self Care | Payer: 59 | Source: Ambulatory Visit | Attending: Cardiovascular Disease | Admitting: Cardiovascular Disease

## 2021-09-26 ENCOUNTER — Other Ambulatory Visit: Payer: Self-pay

## 2021-09-26 VITALS — BP 136/70 | HR 63 | Ht 66.0 in | Wt 261.6 lb

## 2021-09-26 DIAGNOSIS — I480 Paroxysmal atrial fibrillation: Secondary | ICD-10-CM | POA: Diagnosis not present

## 2021-09-26 DIAGNOSIS — Z7901 Long term (current) use of anticoagulants: Secondary | ICD-10-CM | POA: Diagnosis not present

## 2021-09-26 DIAGNOSIS — O209 Hemorrhage in early pregnancy, unspecified: Secondary | ICD-10-CM | POA: Insufficient documentation

## 2021-09-26 DIAGNOSIS — I4891 Unspecified atrial fibrillation: Secondary | ICD-10-CM | POA: Insufficient documentation

## 2021-09-26 MED ORDER — APIXABAN 5 MG PO TABS: 5.0000 mg | ORAL_TABLET | Freq: Two times a day (BID) | ORAL | 0 refills | Status: DC

## 2021-09-26 MED ORDER — DILTIAZEM HCL 30 MG PO TABS: ORAL_TABLET | ORAL | 1 refills | Status: AC

## 2021-09-26 NOTE — Patient Instructions (Signed)
Start Eliquis 5mg  twice a day for one month then stop  Cardizem 30mg  -- take 1 tablet every 4 hours AS NEEDED for heart rate >100 as long as top number of blood pressure >100.     Ordered sleep study - scheduling will call after approval from insurance

## 2021-09-26 NOTE — Progress Notes (Signed)
Primary Care Physician: Roque Cash., MD Referring Physician: [facility] ED f/u    Angel Durham is a 50 y.o. female with a h/o  GERD and migraines.  She presents today for evaluation of palpitations and weakness.  This started approximately 30 minutes prior to presentation.  Patient was asleep at the time, then woke feeling lightheaded and heart racing.  She attempted to walk around the house to get it to go away, however did not.  She denies having any chest pain.  She denies any excessive caffeine or alcohol intake.  She denies any cardiac history.   She was started on a Cardizem drip with some improvement in her rate.  Laboratory studies  were basically unremarkable.  Her vital signs were otherwise stable.  She underwent successful cardioversion. She is now in the afib clinic to f/u.  Ekg shows SR. She has not noted any further afib. Denies tobacco or alcohol use. No excessive caffeine, has been told she snores and she did wake up with afib, suggestive of sleep apnea. She was not started on DOAC x 4 weeks which is standard protocol s/p urgent  cardioversion. No previous echo or sleep study.  CHA2DS2VASc  score of 1(female).   Today, she denies symptoms of palpitations, chest pain, shortness of breath, orthopnea, PND, lower extremity edema, dizziness, presyncope, syncope, or neurologic sequela. The patient is tolerating medications without difficulties and is otherwise without complaint today.   Past Medical History:  Diagnosis Date   Gastroesophageal reflux    Migraine    Past Surgical History:  Procedure Laterality Date   CHOLECYSTECTOMY     SPERMATIC VEIN LIGATION     TONSILLECTOMY      Current Outpatient Medications  Medication Sig Dispense Refill   cyclobenzaprine (FLEXERIL) 5 MG tablet      omeprazole (PRILOSEC) 40 MG capsule Take by mouth.     nadolol (CORGARD) 20 MG tablet Take 20 mg by mouth daily.     omeprazole (PRILOSEC) 40 MG capsule Take 40 mg by mouth  daily.     SUMAtriptan (IMITREX) 5 MG/ACT nasal spray Place 1 spray into the nose every 2 (two) hours as needed for migraine.     zolmitriptan (ZOMIG) 5 MG nasal solution Place into the nose as needed for migraine.     No current facility-administered medications for this encounter.    Allergies  Allergen Reactions   Penicillins Rash    Social History   Socioeconomic History   Marital status: Married    Spouse name: Not on file   Number of children: Not on file   Years of education: Not on file   Highest education level: Not on file  Occupational History   Not on file  Tobacco Use   Smoking status: Former   Smokeless tobacco: Not on file  Substance and Sexual Activity   Alcohol use: No   Drug use: No   Sexual activity: Not on file  Other Topics Concern   Not on file  Social History Narrative   Not on file   Social Determinants of Health   Financial Resource Strain: Not on file  Food Insecurity: Not on file  Transportation Needs: Not on file  Physical Activity: Not on file  Stress: Not on file  Social Connections: Not on file  Intimate Partner Violence: Not on file    No family history on file.  ROS- All systems are reviewed and negative except as per the HPI above  Physical Exam: Vitals:  09/26/21 1021  Weight: 118.7 kg  Height: 5\' 6"  (1.676 m)   Wt Readings from Last 3 Encounters:  09/26/21 118.7 kg  09/21/21 120.2 kg  09/25/14 113.4 kg    Labs: Lab Results  Component Value Date   NA 137 09/21/2021   K 3.8 09/21/2021   CL 103 09/21/2021   CO2 26 09/21/2021   GLUCOSE 110 (H) 09/21/2021   BUN 16 09/21/2021   CREATININE 0.62 09/21/2021   CALCIUM 9.1 09/21/2021   No results found for: INR No results found for: CHOL, HDL, LDLCALC, TRIG   GEN- The patient is well appearing, alert and oriented x 3 today.   Head- normocephalic, atraumatic Eyes-  Sclera clear, conjunctiva pink Ears- hearing intact Oropharynx- clear Neck- supple, no  JVP Lymph- no cervical lymphadenopathy Lungs- Clear to ausculation bilaterally, normal work of breathing Heart- Regular rate and rhythm, no murmurs, rubs or gallops, PMI not laterally displaced GI- soft, NT, ND, + BS Extremities- no clubbing, cyanosis, or edema MS- no significant deformity or atrophy Skin- no rash or lesion Psych- euthymic mood, full affect Neuro- strength and sensation are intact  EKG-Vent. rate 63 BPM PR interval 166 ms QRS duration 98 ms QT/QTcB 406/415 ms P-R-T axes 54 81 56 Normal sinus rhythm Normal ECG  ER ekg's reviewed that showed afib with v rate of 122 bpm   Epic records reviewed  Assessment and Plan:  1. New onset afib Pt had for a very short period of time prior to urgent CV in the ER General education re afib Triggers discussed Will refer for sleep study to assess for sleep apnea Echo ordered  Continue nadolol (she takes for migraines) for beta blockade I will also rx diltiazem  30 mg every 4 hours as needed for break thru afib She was encouraged to get a BP cuff for home use to guide her if future  break thru afib   2. CHA2DS2VASc score of 1 Even thought her stroke risk score is low, it is protocol that everybody ( unless bleeding contraindication) receives 4 weeks of anticoagulation   I will start 5 mg eliquis bid x 4 weeks then pt can stop anticoagulation  Denies bleeding history  Bleeding precautions described   I will call her the results of echo and f/u with be made at that time    09/23/2021 C. Lupita Leash [facility] [address] [place], [place] [phone]

## 2021-10-03 ENCOUNTER — Other Ambulatory Visit (HOSPITAL_COMMUNITY): Payer: Self-pay | Admitting: Nurse Practitioner

## 2021-10-10 ENCOUNTER — Other Ambulatory Visit: Payer: Self-pay | Admitting: Nurse Practitioner

## 2021-10-10 ENCOUNTER — Telehealth: Payer: Self-pay | Admitting: *Deleted

## 2021-10-10 DIAGNOSIS — I4891 Unspecified atrial fibrillation: Secondary | ICD-10-CM

## 2021-10-10 DIAGNOSIS — R0683 Snoring: Secondary | ICD-10-CM

## 2021-10-10 NOTE — Telephone Encounter (Signed)
-----   Message from Juluis Mire, RN sent at 09/26/2021 11:14 AM EST ----- Regarding: sleep study Pt needs sleep study for afib, snoring per donna carroll  Thanks Marzetta Board

## 2021-10-10 NOTE — Telephone Encounter (Signed)
Left message to return a call to discuss sleep study appointment details. 

## 2021-10-11 NOTE — Telephone Encounter (Signed)
Patient returned a call to me and was given HST appointment details. 

## 2021-10-19 ENCOUNTER — Ambulatory Visit (HOSPITAL_BASED_OUTPATIENT_CLINIC_OR_DEPARTMENT_OTHER)
Admission: RE | Admit: 2021-10-19 | Discharge: 2021-10-19 | Disposition: A | Discharge status: Home / Self Care | Payer: 59 | Source: Ambulatory Visit | Attending: Nurse Practitioner | Admitting: Nurse Practitioner

## 2021-10-19 ENCOUNTER — Other Ambulatory Visit: Payer: Self-pay

## 2021-10-19 DIAGNOSIS — I480 Paroxysmal atrial fibrillation: Secondary | ICD-10-CM | POA: Diagnosis not present

## 2021-10-19 NOTE — Progress Notes (Signed)
°  Echocardiogram 2D Echocardiogram has been performed.  Angel Durham F 10/19/2021, 8:17 AM

## 2021-10-20 LAB — ECHOCARDIOGRAM COMPLETE
AR max vel: 2.32 cm2
AV Area VTI: 2.18 cm2
AV Area mean vel: 2.24 cm2
AV Mean grad: 3 mmHg
AV Peak grad: 6.1 mmHg
Ao pk vel: 1.23 m/s
Area-P 1/2: 2.68 cm2
S' Lateral: 3.3 cm

## 2021-10-23 ENCOUNTER — Other Ambulatory Visit (HOSPITAL_COMMUNITY): Payer: Self-pay | Admitting: Nurse Practitioner

## 2021-10-23 ENCOUNTER — Ambulatory Visit: Payer: 59 | Admitting: Cardiovascular Disease

## 2021-10-24 ENCOUNTER — Encounter (HOSPITAL_COMMUNITY): Payer: Self-pay | Admitting: *Deleted

## 2021-10-31 ENCOUNTER — Ambulatory Visit (HOSPITAL_BASED_OUTPATIENT_CLINIC_OR_DEPARTMENT_OTHER): Payer: 59 | Admitting: Cardiovascular Disease

## 2021-10-31 ENCOUNTER — Other Ambulatory Visit: Payer: Self-pay

## 2021-10-31 DIAGNOSIS — R0683 Snoring: Secondary | ICD-10-CM

## 2021-10-31 DIAGNOSIS — I4891 Unspecified atrial fibrillation: Secondary | ICD-10-CM

## 2021-11-10 ENCOUNTER — Ambulatory Visit (HOSPITAL_BASED_OUTPATIENT_CLINIC_OR_DEPARTMENT_OTHER): Payer: 59 | Attending: Nurse Practitioner | Admitting: Cardiovascular Disease

## 2021-11-10 DIAGNOSIS — I4891 Unspecified atrial fibrillation: Secondary | ICD-10-CM | POA: Insufficient documentation

## 2021-11-10 DIAGNOSIS — Z6841 Body Mass Index (BMI) 40.0 and over, adult: Secondary | ICD-10-CM | POA: Insufficient documentation

## 2021-11-10 DIAGNOSIS — R0683 Snoring: Secondary | ICD-10-CM | POA: Diagnosis present

## 2021-11-10 DIAGNOSIS — G4733 Obstructive sleep apnea (adult) (pediatric): Secondary | ICD-10-CM | POA: Insufficient documentation

## 2021-11-10 DIAGNOSIS — G4736 Sleep related hypoventilation in conditions classified elsewhere: Secondary | ICD-10-CM | POA: Insufficient documentation

## 2021-11-29 ENCOUNTER — Encounter (HOSPITAL_BASED_OUTPATIENT_CLINIC_OR_DEPARTMENT_OTHER): Payer: Self-pay | Admitting: Cardiovascular Disease

## 2021-11-29 NOTE — Procedures (Signed)
° ° ° °  Patient Name: Angel Durham, Danial Date: 11/10/2021 Gender: Female D.O.B: July 24, 1971 Age (years): 50 Referring Provider: Sherran Needs Height (inches): 3 Interpreting Physician: Shelva Majestic MD, ABSM Weight (lbs): 260 RPSGT: Jacolyn Reedy BMI: 67 MRN: [id number] Neck Size: 14.50  CLINICAL INFORMATION Sleep Study Type: HST  Indication for sleep study: atrial fibrillation, snoring, morbid obesity  Epworth Sleepiness Score: 5  SLEEP STUDY TECHNIQUE A multi-channel overnight portable sleep study was performed. The channels recorded were: nasal airflow, thoracic respiratory movement, and oxygen saturation with a pulse oximetry. Snoring was also monitored.  MEDICATIONS Patient self administered medications include: N/A.  SLEEP ARCHITECTURE Patient was studied for 359.5 minutes. The sleep efficiency was 100.0 % and the patient was supine for 1.1%. The arousal index was 0.0 per hour.  RESPIRATORY PARAMETERS The overall AHI was 15.4 per hour, with a central apnea index of 0 per hour.  The oxygen nadir was 76% during sleep.  CARDIAC DATA Mean heart rate during sleep was 65.6 bpm.  IMPRESSIONS - Moderate obstructive sleep apnea occurred during this study (AHI 15.4/h). The severity during REM sleep cannot be assessed on this study. - Severe oxygen desaturationto a nadir of 76%. - Patient snored for 47.1 minutes (13.1%) during the sleep.  DIAGNOSIS - Obstructive Sleep Apnea (G47.33) - Nocturnal Hypoxemia (G47.36)  RECOMMENDATIONS - In this patient with significant cardiovascular co-morbidities and severe nocturnal oxygen desaturation recommend therapeutic CPAP for treatment of her sleep disordered breathing. If unable to obtain an in-lab titration, initiate Auto-PAP with EPR of 3 at 7 - 16 cm of water. - Effort should be made to optimize nasal and orpharyngeal patency. - Avoid alcohol, sedatives and other CNS depressants that may worsen sleep apnea and  disrupt normal sleep architecture. - Sleep hygiene should be reviewed to assess factors that may improve sleep quality. - Weight management (BMI 42) and regular exercise should be initiated or continued. - Recommend a download and sleep clinic evaluation after one month of therapy.    [Electronically signed] 11/29/2021 09:47 PM  Shelva Majestic MD, [facility], [place], American Board of Sleep Medicine   NPI: [id number]  [employer] PH: [phone]   FX: [phone] [place]

## 2021-12-01 ENCOUNTER — Other Ambulatory Visit: Payer: Self-pay | Admitting: Cardiovascular Disease

## 2021-12-01 ENCOUNTER — Telehealth: Payer: Self-pay | Admitting: *Deleted

## 2021-12-01 DIAGNOSIS — G4733 Obstructive sleep apnea (adult) (pediatric): Secondary | ICD-10-CM

## 2021-12-01 DIAGNOSIS — G4736 Sleep related hypoventilation in conditions classified elsewhere: Secondary | ICD-10-CM

## 2021-12-01 NOTE — Telephone Encounter (Signed)
Patient notified of HST results and recommendations. She agrees to proceed with CPAP titration if insurance allows. ?

## 2021-12-01 NOTE — Telephone Encounter (Signed)
-----   Message from Troy Sine, MD sent at 11/29/2021  9:51 PM EST ----- ?Mariann Laster, please notify pt of results. If unable to obtain an in-lab titration, initiate Auto-PAP ?

## 2022-01-19 LAB — HM PAP SMEAR

## 2022-01-31 ENCOUNTER — Ambulatory Visit: Payer: 59 | Admitting: Cardiovascular Disease

## 2022-01-31 ENCOUNTER — Encounter: Payer: Self-pay | Admitting: Cardiovascular Disease

## 2022-01-31 VITALS — BP 120/80 | HR 67 | Ht 66.75 in | Wt 264.8 lb

## 2022-01-31 DIAGNOSIS — I480 Paroxysmal atrial fibrillation: Secondary | ICD-10-CM

## 2022-01-31 DIAGNOSIS — G4733 Obstructive sleep apnea (adult) (pediatric): Secondary | ICD-10-CM

## 2022-01-31 DIAGNOSIS — G4736 Sleep related hypoventilation in conditions classified elsewhere: Secondary | ICD-10-CM

## 2022-01-31 NOTE — Patient Instructions (Addendum)
Medication Instructions:  ?Not needed ? ?*If you need a refill on your cardiac medications before your next appointment, please call your pharmacy* ? ? ?Lab Work: ?Not needed ? ? ? ?Testing/Procedures: ?Not needed ? ? ?Follow-Up: ?At [facility], you and your health needs are our priority.  As part of our continuing mission to provide you with exceptional heart care, we have created designated Provider Care Teams.  These Care Teams include your primary Cardiologist (physician) and Advanced Practice Providers (APPs -  Physician Assistants and Nurse Practitioners) who all work together to provide you with the care you need, when you need it. ? ?  ? ?Your next appointment:   ?As needed - sleep clinic ? ?The format for your next appointment:   ?In Person ? ?Provider:   ?Nicki Guadalajara   ? ? ?Other Instructions  ? NO  pressure changes were done to your C-PAP  today  ?

## 2022-01-31 NOTE — Progress Notes (Signed)
? ?Cardiology Office Note   ? ?Date:  02/07/2022  ? ?ID:  Angel Durham, DOB [birth year], MRN [id number] ? ?PCP:  Roque Cash., MD  ?Cardiologist:  Shelva Majestic, MD (sleep); Roderic Palau, [facility] ? ?New sleep evaluation ? ? ?History of Present Illness:  ?Angel Durham is a 51 y.o. female who has a history of GERD, migraine headaches, and recently had developed palpitations and weakness.  She presented to the emergency room on September 21, 2021 and was found to be in atrial fibrillation.  She was started on a Cardizem drip with some improvement in her rapid heart rate.  Laboratory studies were unremarkable.  She underwent successful DC cardioversion with restoration of sinus rhythm.  She subsequently presented to atrial fibrillation clinic and was evaluated by Roderic Palau, NP on September 26, 2021.  ECG showed sinus rhythm.  She denied any recurrent episodes of palpitations.  There was no history of tobacco or alcohol use or excessive caffeine use.  Upon questioning it was noted that she doors and when her symptoms had initially occurred she was awakened from sleep with atrial fibrillation.  She was started on Eliquis for 4 weeks.  CHA2DS2-VASc score was 1.  She underwent a 2D echo Doppler study on October 19, 2021 which showed normal LV function with EF 55 to 60% and grade 1 diastolic relaxation action.  With her awakening from sleep with atrial fibrillation and history of snoring she was referred for a sleep study which was done on November 10, 2021 and was a home study.  This revealed moderate overall sleep apnea with an AHI of 15.4.  She had significant oxygen desaturation to a nadir of 76%.  The severity during REM sleep could not be assessed on this study.  She snored for 47.1 minutes of her sleep duration representing 13.1%.  She subsequently initiated AutoPap therapy and had a CPAP set up date on December 26, 2021 with a ResMed air sense 11 AutoSet CPAP unit.  Her initial pressure to a range of 7 to 16  cm of water.  She has been using a ResMed AirFit N30i mask.  A download was obtained from March 28 through January 24, 2022.  Compliance is excellent at 100%.  Average sleep duration with CPAP was 6 hours and 42 minutes.  Her 95th percentile pressure was 11.1 and maximum average pressure 12.3 cm of water.  AHI was excellent at 0.7/h. ? ?Since initiating CPAP therapy she has felt improved.  She is unaware of any residual daytime snoring.  She denies any recurrent palpitations or atrial fibrillation.  Typically she goes to bed at 10 PM and wakes up at 5:30 AM.  Is only, she is on a baby aspirin, now Talal 20 mg daily, and has a prescription for diltiazem 30 mg to take as needed with heart rate greater than 100.  She presents for her initial sleep evaluation. ? ? ?Past Medical History:  ?Diagnosis Date  ? Gastroesophageal reflux   ? Migraine   ? ? ?Past Surgical History:  ?Procedure Laterality Date  ? CHOLECYSTECTOMY    ? SPERMATIC VEIN LIGATION    ? TONSILLECTOMY    ? ? ?Current Medications: ?Outpatient Medications Prior to Visit  ?Medication Sig Dispense Refill  ? cyclobenzaprine (FLEXERIL) 5 MG tablet Take 5 mg by mouth as needed.    ? diltiazem (CARDIZEM) 30 MG tablet Take 1 tablet every 4 hours AS NEEDED for heart rate >100 30 tablet 1  ? nadolol (CORGARD) 20  MG tablet Take 20 mg by mouth daily.    ? omeprazole (PRILOSEC) 40 MG capsule Take 40 mg by mouth daily.    ? ondansetron (ZOFRAN-ODT) 4 MG disintegrating tablet Take by mouth.    ? SUMAtriptan (IMITREX) 5 MG/ACT nasal spray Place 1 spray into the nose every 2 (two) hours as needed for migraine.    ? zolmitriptan (ZOMIG) 5 MG nasal solution Place into the nose as needed for migraine.    ? aspirin 81 MG EC tablet Take 81 mg by mouth daily.    ? apixaban (ELIQUIS) 5 MG TABS tablet Take 1 tablet (5 mg total) by mouth 2 (two) times daily. (Patient not taking: Reported on 01/31/2022) 60 tablet 0  ? ?No facility-administered medications prior to visit.  ?   ? ?Allergies:   Penicillins  ? ?Social History  ? ?Socioeconomic History  ? Marital status: Married  ?  Spouse name: Not on file  ? Number of children: Not on file  ? Years of education: Not on file  ? Highest education level: Not on file  ?Occupational History  ? Not on file  ?Tobacco Use  ? Smoking status: Former  ? Smokeless tobacco: Not on file  ?Substance and Sexual Activity  ? Alcohol use: No  ? Drug use: No  ? Sexual activity: Not on file  ?Other Topics Concern  ? Not on file  ?Social History Narrative  ? Not on file  ? ?Social Determinants of Health  ? ?Financial Resource Strain: Not on file  ?Food Insecurity: Not on file  ?Transportation Needs: Not on file  ?Physical Activity: Not on file  ?Stress: Not on file  ?Social Connections: Not on file  ?  ? ?Socially she was born in [place], [place].  She is married and has an 39 year old child. ? ?Family History: Family history is notable that her father is 83 and mother 14.  Her maternal grand father had a myocardial infarction in his 85s.  Grandmother had COPD. ? ?ROS ?General: Negative; No fevers, chills, or night sweats;  ?HEENT: Negative; No changes in vision or hearing, sinus congestion, difficulty swallowing ?Pulmonary: Negative; No cough, wheezing, shortness of breath, hemoptysis ?Cardiovascular: PAF x1, no chest pain or shortness of breath. ?GI: Negative; No nausea, vomiting, diarrhea, or abdominal pain ?GU: Negative; No dysuria, hematuria, or difficulty voiding ?Musculoskeletal: Negative; no myalgias, joint pain, or weakness ?Hematologic/Oncology: Negative; no easy bruising, bleeding ?Endocrine: Negative; no heat/cold intolerance; no diabetes ?Neuro: Negative; no changes in balance, headaches ?Skin: Negative; No rashes or skin lesions ?Psychiatric: Negative; No behavioral problems, depression ?Sleep: Only diagnosed OSA; history of snoring.  Since initiating CPAP, sleeping well.  Epworth Sleepiness Scale score was calculated the office today and this  endorsed at 6 arguing against residual daytime sleepiness.No bruxism, restless legs, hypnogognic hallucinations, no cataplexy ?Other comprehensive 14 point system review is negative. ? ? ?PHYSICAL EXAM:   ?VS:  BP 120/80 (BP Location: Left Arm)   Pulse 67   Ht 5' 6.75" (1.695 m)   Wt 264 lb 12.8 oz (120.1 kg)   SpO2 100%   BMI 41.78 kg/m?    ? ?Repeat blood pressure by me was 114/70 ? ?Wt Readings from Last 3 Encounters:  ?01/31/22 264 lb 12.8 oz (120.1 kg)  ?10/31/21 260 lb (117.9 kg)  ?09/26/21 261 lb 9.6 oz (118.7 kg)  ?  ?General: Alert, oriented, no distress.  ?Skin: normal turgor, no rashes, warm and dry ?HEENT: Normocephalic, atraumatic. Pupils equal round and reactive  to light; sclera anicteric; extraocular muscles intact;  ?Nose without nasal septal hypertrophy ?Mouth/Parynx benign; Mallinpatti scale 3 ?Neck: No JVD, no carotid bruits; normal carotid upstroke ?Lungs: clear to ausculatation and percussion; no wheezing or rales ?Chest wall: without tenderness to palpitation ?Heart: PMI not displaced, RRR, s1 s2 normal, 1/6 systolic murmur, no diastolic murmur, no rubs, gallops, thrills, or heaves ?Abdomen: soft, nontender; no hepatosplenomehaly, BS+; abdominal aorta nontender and not dilated by palpation. ?Back: no CVA tenderness ?Pulses 2+ ?Musculoskeletal: full range of motion, normal strength, no joint deformities ?Extremities: no clubbing cyanosis or edema, Homan's sign negative  ?Neurologic: grossly nonfocal; Cranial nerves grossly wnl ?Psychologic: Normal mood and affect ? ? ?Studies/Labs Reviewed:  ? ?ECG (independently read by me):NSR at 67, normal intervals  ? ?Recent Labs: ? ?  Latest Ref Rng & Units 09/21/2021  ? 12:59 AM  ?BMP  ?Glucose 70 - 99 mg/dL 110    ?BUN 6 - 20 mg/dL 16    ?Creatinine 0.44 - 1.00 mg/dL 0.62    ?Sodium 135 - 145 mmol/L 137    ?Potassium 3.5 - 5.1 mmol/L 3.8    ?Chloride 98 - 111 mmol/L 103    ?CO2 22 - 32 mmol/L 26    ?Calcium 8.9 - 10.3 mg/dL 9.1    ? ? ? ? ?  Latest  Ref Rng & Units 09/21/2021  ? 12:59 AM  ?Hepatic Function  ?Total Protein 6.5 - 8.1 g/dL 7.7    ?Albumin 3.5 - 5.0 g/dL 3.9    ?AST 15 - 41 U/L 22    ?ALT 0 - 44 U/L 20    ?Alk Phosphatase 38 - 126 U/L 71    ?Total Biliru

## 2022-02-07 ENCOUNTER — Encounter: Payer: Self-pay | Admitting: Cardiovascular Disease

## 2022-03-29 IMAGING — US US THYROID
1 series · 12 of 25 positions shown · non-contrast
Comparison: 04/09/2019; 08/01/2018

CLINICAL DATA: Prior ultrasound follow-up. Follow-up thyroid
nodules.

EXAM:
THYROID ULTRASOUND
TECHNIQUE: Ultrasound examination of the thyroid gland and adjacent soft
tissues was performed.

[Series 1: us thyroid · 0.04mm/px · 12 of 36 slices shown]
[im 2/36]
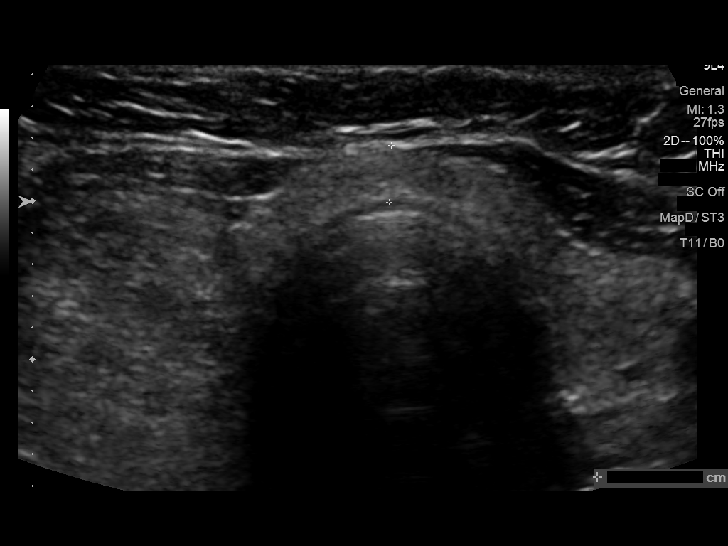
[im 5/36]
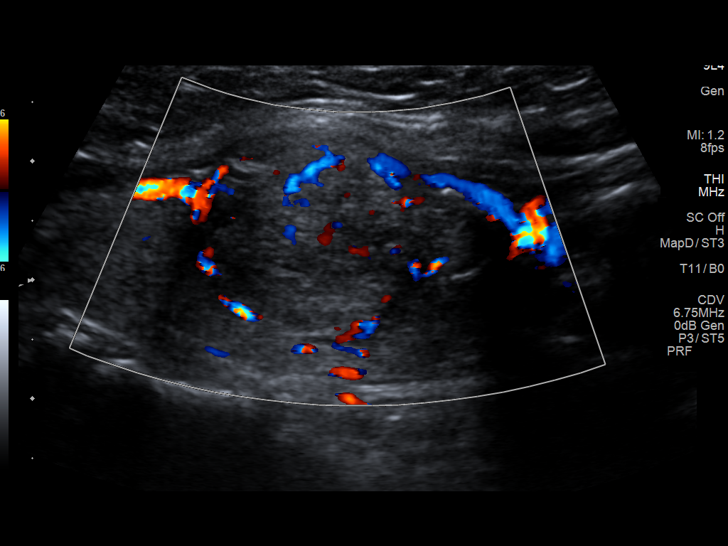
[im 8/36]
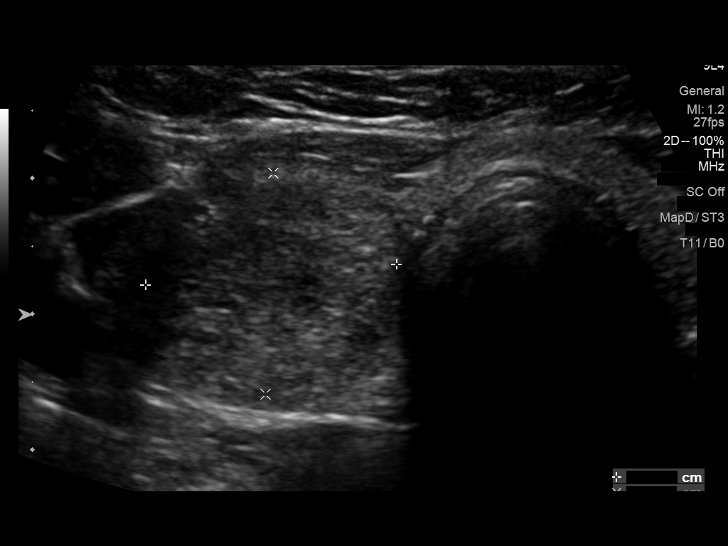
[im 11/36]
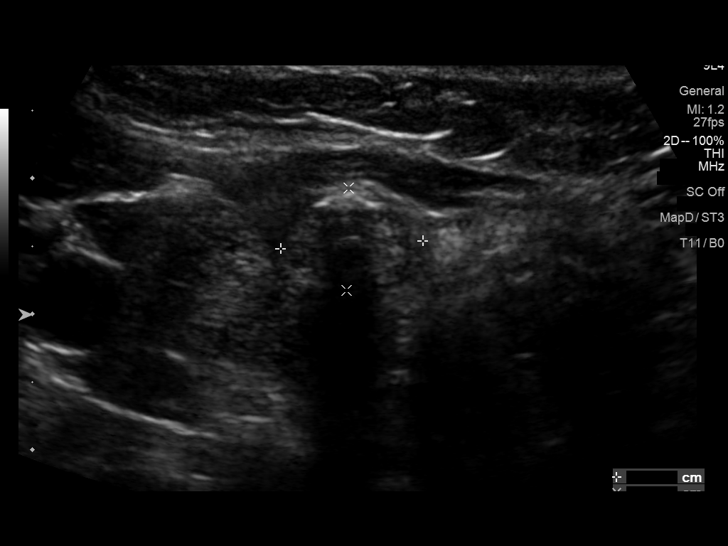
[im 14/36]
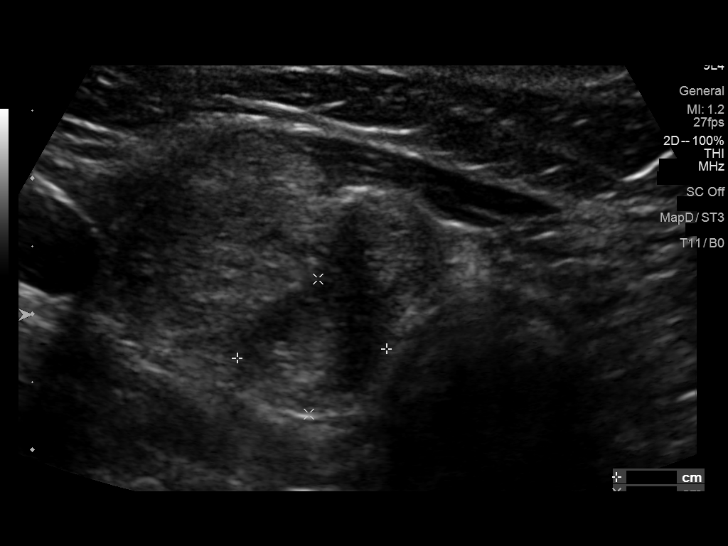
[im 17/36]
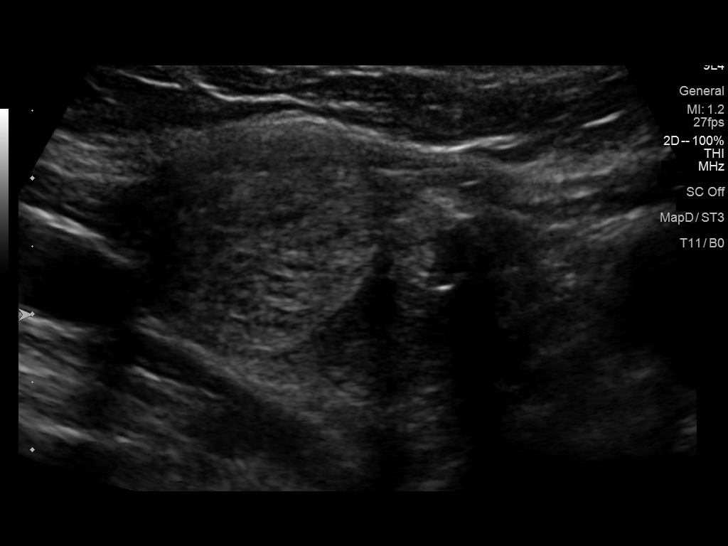
[im 19/36]
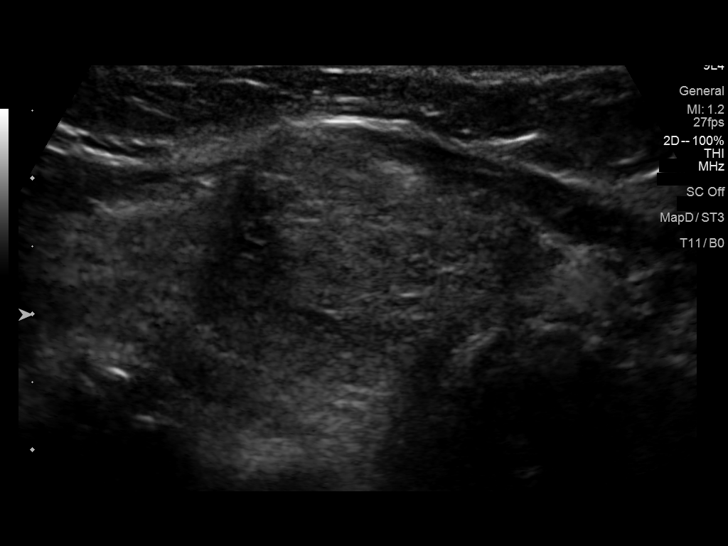
[im 22/36]
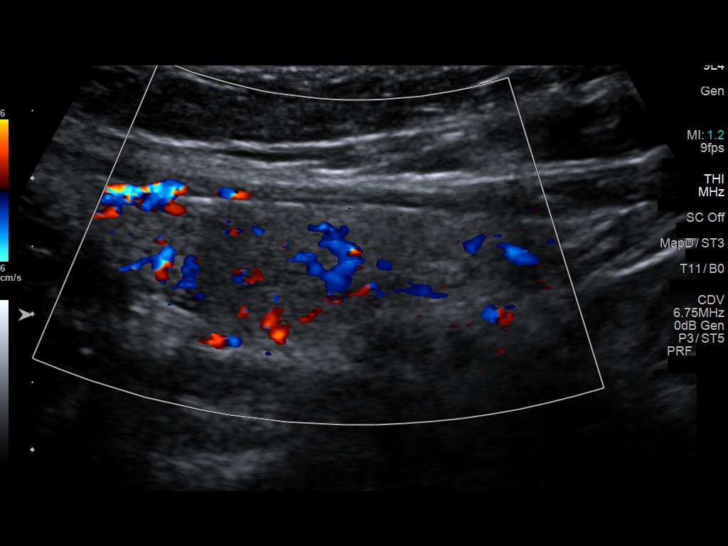
[im 25/36]
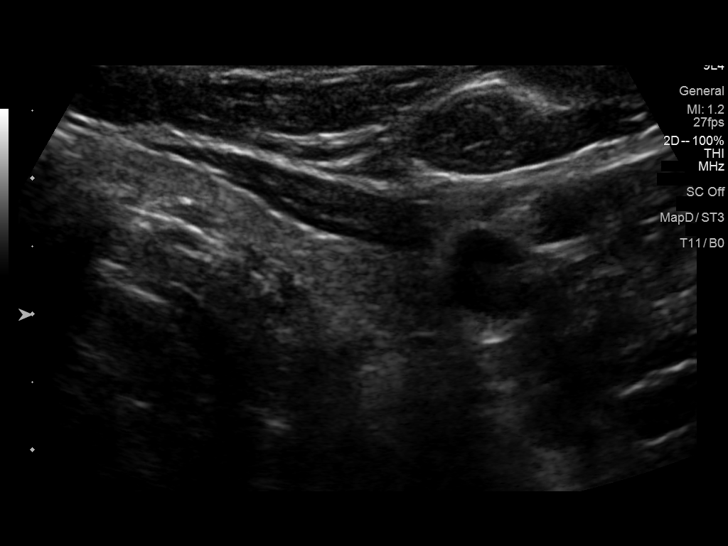
[im 28/36]
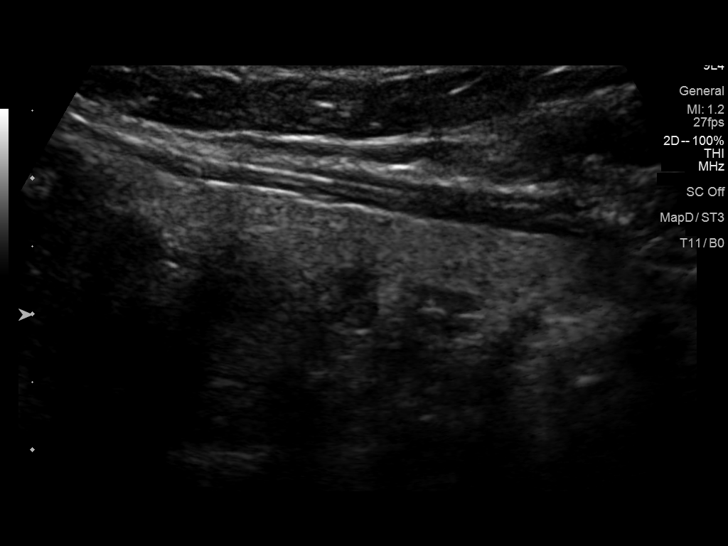
[im 31/36]
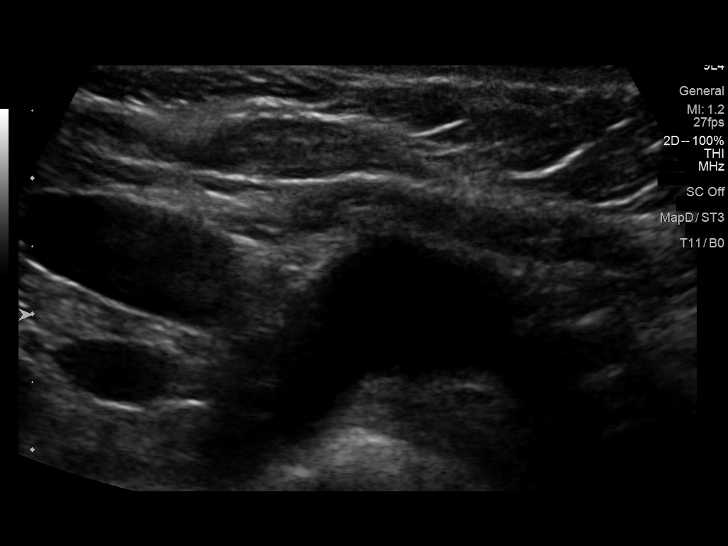
[im 34/36]
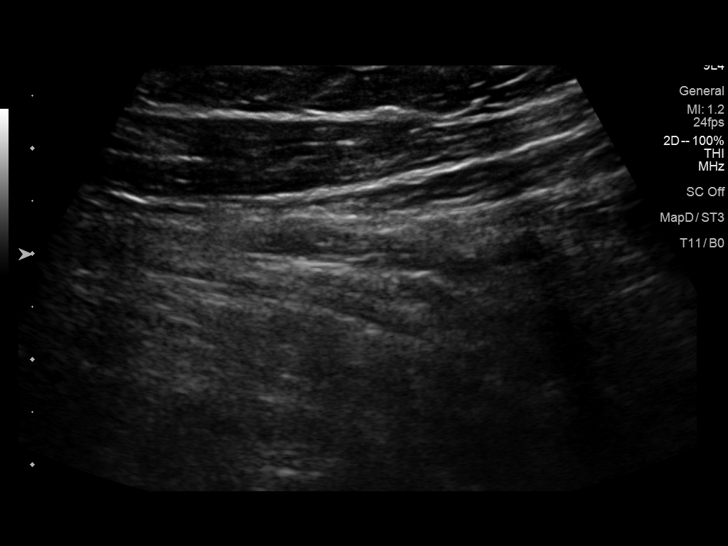

[12 of 25 positions shown; findings below may reference images not displayed]

FINDINGS: Parenchymal Echotexture: Mildly heterogenous

Isthmus: Normal in size measures 0.4 cm in diameter, unchanged

Right lobe: Normal in size measuring 4.5 x 2.3 x 2.5 cm, previously,
4.6 x 2.0 x 2.3 cm

Left lobe: Normal in size measuring 3.8 x 1.0 x 1.3 cm, previously,
4.1 x 1.1 x 1.0 cm

_________________________________________________________

Estimated total number of nodules >/= 1 cm: 3

Number of spongiform nodules >/=  2 cm not described below (TR1): 0

Number of mixed cystic and solid nodules >/= 1.5 cm not described
below (TR2): 0

_________________________________________________________

Nodule # 1:

Prior biopsy: No

Location: Right; Mid

Maximum size: 2.2 cm; Other 2 dimensions: 1.9 x 1.6 cm, previously,
2.3 x 2.1 x 1.6 cm (when compared to the [DATE] examination)

Composition: solid/almost completely solid (2)

Echogenicity: isoechoic (1)

Shape: not taller-than-wide (0)

Margins: ill-defined (0)

Echogenic foci: none (0)

ACR TI-RADS total points: 3.

ACR TI-RADS risk category:  TR3 (3 points).

Significant change in size (>/= 20% in two dimensions and minimal
increase of 2 mm): No

Change in features: No

Change in ACR TI-RADS risk category: No

ACR TI-RADS recommendations:

*Given size (>/= 1.5 - 2.4 cm) and appearance, a follow-up
ultrasound in 1 year should be considered based on TI-RADS criteria.

_________________________________________________________

Nodule # 2:

Prior biopsy: No

Location: Right; Inferior

Maximum size: 0.9 cm; Other 2 dimensions: 0.9 x 0.8 cm, previously,
0.9 x 0.8 x 0.7 cm (when compared to the [DATE] examination)

Composition: solid/almost completely solid (2)

Echogenicity: isoechoic (1)

Shape: not taller-than-wide (0)

Margins: ill-defined (0)

Echogenic foci: peripheral calcifications (2)

ACR TI-RADS total points: 5.

ACR TI-RADS risk category:  TR4 (4-6 points).

Significant change in size (>/= 20% in two dimensions and minimal
increase of 2 mm): No

Change in features: No

Change in ACR TI-RADS risk category: No

ACR TI-RADS recommendations:

Given size (<0.9 cm) and appearance, this nodule does NOT meet
TI-RADS criteria for biopsy or dedicated follow-up.

_________________________________________________________

Questioned 1.1 cm hypoechoic ill-defined nodule within the inferior
pole the right lobe of the thyroid (labeled 3), is favored to
represent a pseudonodule as it lacks defined borders on both
provided transverse and sagittal images.
IMPRESSION: 1. Similar findings of multinodular goiter. No definitive worrisome
new or enlarging thyroid nodules.
2. Nodule #1 is unchanged compared to the [DATE] examination though
again meets imaging criteria to recommend annual/biannual
surveillance. This examination documents 18 months of stability.
and thus a benign etiology.

The above is in keeping with the ACR TI-RADS recommendations - [HOSPITAL] 2334;[DATE].

## 2022-04-10 ENCOUNTER — Other Ambulatory Visit: Payer: Self-pay | Admitting: Internal Medicine

## 2022-04-10 DIAGNOSIS — E041 Nontoxic single thyroid nodule: Secondary | ICD-10-CM

## 2022-04-11 ENCOUNTER — Ambulatory Visit (INDEPENDENT_AMBULATORY_CARE_PROVIDER_SITE_OTHER): Payer: 59

## 2022-04-11 ENCOUNTER — Ambulatory Visit: Payer: 59 | Admitting: Sports Medicine

## 2022-04-11 DIAGNOSIS — E669 Obesity, unspecified: Secondary | ICD-10-CM | POA: Insufficient documentation

## 2022-04-11 DIAGNOSIS — M25562 Pain in left knee: Secondary | ICD-10-CM | POA: Diagnosis not present

## 2022-04-11 DIAGNOSIS — G8929 Other chronic pain: Secondary | ICD-10-CM | POA: Diagnosis not present

## 2022-04-11 DIAGNOSIS — M25561 Pain in right knee: Secondary | ICD-10-CM

## 2022-04-11 DIAGNOSIS — M1612 Unilateral primary osteoarthritis, left hip: Secondary | ICD-10-CM | POA: Diagnosis not present

## 2022-04-11 DIAGNOSIS — M549 Dorsalgia, unspecified: Secondary | ICD-10-CM

## 2022-04-11 MED ORDER — CELECOXIB 200 MG PO CAPS
ORAL_CAPSULE | ORAL | 2 refills | Status: DC
Start: 2022-04-11 — End: 2022-08-30

## 2022-04-11 NOTE — Assessment & Plan Note (Signed)
Morbid obesity, sounds like weight loss medications or plan exclusion, they do however cover bariatric surgery, we discussed the options, pros and cons. I think she would be a good candidate for bariatric surgery as she does need to lose a good amount of weight. Another option would be to use boot leg [facility] at [facility]. She will establish with one of our primary care provider.

## 2022-04-11 NOTE — Assessment & Plan Note (Signed)
Pleasant 51 year old female, morbid obesity, history of left hip and thigh pain, moderate gelling. She does have some back pain and some knee pain so we will image the structures as well. She does have reproduction of pain with internal rotation of the hip, with significant loss of internal rotation on the left. We will start with x-rays, Celebrex, home conditioning, return to see me in 6 weeks, hip joint injection if not better.

## 2022-04-11 NOTE — Progress Notes (Signed)
    Procedures performed today:    None.  Independent interpretation of notes and tests performed by another provider:   None.  Brief History, Exam, Impression, and Recommendations:    Primary osteoarthritis of left hip Pleasant 51 year old female, morbid obesity, history of left hip and thigh pain, moderate gelling. She does have some back pain and some knee pain so we will image the structures as well. She does have reproduction of pain with internal rotation of the hip, with significant loss of internal rotation on the left. We will start with x-rays, Celebrex, home conditioning, return to see me in 6 weeks, hip joint injection if not better.  Morbid obesity (HCC) Morbid obesity, sounds like weight loss medications or plan exclusion, they do however cover bariatric surgery, we discussed the options, pros and cons. I think she would be a good candidate for bariatric surgery as she does need to lose a good amount of weight. Another option would be to use [facility] leg [facility]. She will establish with one of our primary care provider.    ____________________________________________ Ihor Austin. Benjamin Stain, M.D., ABFM., CAQSM., AME. Primary Care and Sports Medicine [facility]  Adjunct Professor of Family Medicine  [place] [employer] [facility] [employer]  Restaurant manager, fast food

## 2022-04-12 ENCOUNTER — Ambulatory Visit
Admission: RE | Admit: 2022-04-12 | Discharge: 2022-04-12 | Disposition: A | Discharge status: Home / Self Care | Payer: 59 | Source: Ambulatory Visit | Attending: Internal Medicine | Admitting: Internal Medicine

## 2022-04-12 DIAGNOSIS — E041 Nontoxic single thyroid nodule: Secondary | ICD-10-CM

## 2022-05-23 ENCOUNTER — Ambulatory Visit: Payer: 59 | Admitting: Sports Medicine

## 2022-05-23 DIAGNOSIS — M1612 Unilateral primary osteoarthritis, left hip: Secondary | ICD-10-CM | POA: Diagnosis not present

## 2022-05-23 LAB — HM MAMMOGRAPHY

## 2022-05-23 NOTE — Progress Notes (Signed)
    Procedures performed today:    None.  Independent interpretation of notes and tests performed by another provider:   None.  Brief History, Exam, Impression, and Recommendations:    Primary osteoarthritis of left hip This pleasant 51 year old female returns, left hip osteoarthritis, x-rays confirmed, pain with internal rotation of the hip. Celebrex and home conditioning seem to help considerably. She has not yet plateaued in terms of improvement so we will hold off on intervention for now, return to see me 4-6 more weeks for determination of whether she needs a shot or not.    ____________________________________________ Ihor Austin. Benjamin Stain, M.D., ABFM., CAQSM., AME. Primary Care and Sports Medicine [facility]  Adjunct Professor of Family Medicine  [place] [employer] [facility] [employer]  Restaurant manager, fast food

## 2022-05-23 NOTE — Assessment & Plan Note (Signed)
This pleasant 51 year old female returns, left hip osteoarthritis, x-rays confirmed, pain with internal rotation of the hip. Celebrex and home conditioning seem to help considerably. She has not yet plateaued in terms of improvement so we will hold off on intervention for now, return to see me 4-6 more weeks for determination of whether she needs a shot or not.

## 2022-06-14 ENCOUNTER — Encounter: Payer: Self-pay | Admitting: Family Medicine

## 2022-06-14 ENCOUNTER — Ambulatory Visit: Payer: 59 | Admitting: Family Medicine

## 2022-06-14 VITALS — BP 110/50 | HR 60 | Ht 66.75 in | Wt 267.0 lb

## 2022-06-14 DIAGNOSIS — E8881 Metabolic syndrome: Secondary | ICD-10-CM | POA: Diagnosis not present

## 2022-06-14 DIAGNOSIS — E66813 Obesity, class 3: Secondary | ICD-10-CM | POA: Insufficient documentation

## 2022-06-14 DIAGNOSIS — R632 Polyphagia: Secondary | ICD-10-CM | POA: Diagnosis not present

## 2022-06-14 MED ORDER — RYBELSUS 3 MG PO TABS: 1.0000 | ORAL_TABLET | Freq: Every day | ORAL | 0 refills | Status: DC

## 2022-06-14 NOTE — Assessment & Plan Note (Signed)
-   prescribed rybelsus 3mg  tab for patient  - heavily counseled on diet control and we have created a plan to do decreased portion sizes at dinner time and also eat healthy snacks.  - we did discuss exercise, but will work on this at next visit.

## 2022-06-14 NOTE — Progress Notes (Signed)
New Patient Office Visit  Subjective    Patient ID: Angel Durham, female    DOB: 14-Apr-1971  Age: 51 y.o. MRN: [id number]  CC:  Chief Complaint  Patient presents with   Establish Care    HPI Angel Durham presents to establish care. She is here to address weight loss concerns.   Diet: she tends to eat more if she is home. She is a snack. Dinner is a larger portion meal with veggies and starch. She does not do late night snacking. She quit tea and is only drinking water. She will drink a [employer] once a day, venti white mocha.  Exercise: quit the gym for about a year. Is wanting to start to exercise.    Outpatient Encounter Medications as of 06/14/2022  Medication Sig   aspirin 81 MG EC tablet Take 81 mg by mouth daily.   celecoxib (CELEBREX) 200 MG capsule One to 2 tablets by mouth daily as needed for pain.   diltiazem (CARDIZEM) 30 MG tablet Take 1 tablet every 4 hours AS NEEDED for heart rate >100   nadolol (CORGARD) 20 MG tablet Take 20 mg by mouth daily.   omeprazole (PRILOSEC) 40 MG capsule Take 40 mg by mouth daily.   ondansetron (ZOFRAN-ODT) 4 MG disintegrating tablet Take by mouth.   Semaglutide (RYBELSUS) 3 MG TABS Take 1 tablet by mouth daily.   Semaglutide (RYBELSUS) 3 MG TABS Take 1 tablet by mouth daily.   zolmitriptan (ZOMIG) 5 MG nasal solution Place into the nose as needed for migraine.   No facility-administered encounter medications on file as of 06/14/2022.    Past Medical History:  Diagnosis Date   Atrial fibrillation (HCC)    Gastroesophageal reflux    Migraine     Past Surgical History:  Procedure Laterality Date   CHOLECYSTECTOMY  2007   SPERMATIC VEIN LIGATION     TONSILLECTOMY      Family History  Problem Relation Age of Onset   Hypercholesterolemia Mother    Hypertension Mother    Diabetes Father    Hypertension Father     Social History   Socioeconomic History   Marital status: Married    Spouse name: Not on file   Number  of children: Not on file   Years of education: Not on file   Highest education level: Not on file  Occupational History   Not on file  Tobacco Use   Smoking status: Former   Smokeless tobacco: Not on file  Substance and Sexual Activity   Alcohol use: No   Drug use: No   Sexual activity: Not on file  Other Topics Concern   Not on file  Social History Narrative   Not on file   Social Determinants of Health   Financial Resource Strain: Not on file  Food Insecurity: Not on file  Transportation Needs: Not on file  Physical Activity: Not on file  Stress: Not on file  Social Connections: Not on file  Intimate Partner Violence: Not on file    Review of Systems  Constitutional:  Negative for chills and fever.  Respiratory:  Negative for cough and shortness of breath.   Cardiovascular:  Negative for chest pain.  Neurological:  Negative for headaches.        Objective    BP (!) 110/50   Pulse 60   Ht 5' 6.75" (1.695 m)   Wt 267 lb (121.1 kg)   SpO2 99%   BMI 42.13 kg/m   Physical  Exam Vitals and nursing note reviewed.  Constitutional:      General: She is not in acute distress.    Appearance: Normal appearance.  HENT:     Head: Normocephalic and atraumatic.     Right Ear: External ear normal.     Left Ear: External ear normal.     Nose: Nose normal.  Eyes:     Conjunctiva/sclera: Conjunctivae normal.  Cardiovascular:     Rate and Rhythm: Normal rate and regular rhythm.  Pulmonary:     Effort: Pulmonary effort is normal.     Breath sounds: Normal breath sounds.  Neurological:     General: No focal deficit present.     Mental Status: She is alert and oriented to person, place, and time.  Psychiatric:        Mood and Affect: Mood normal.        Behavior: Behavior normal.        Thought Content: Thought content normal.        Judgment: Judgment normal.         Assessment & Plan:   Problem List Items Addressed This Visit       Other   Obesity,  Class III, BMI 40-49.9 (morbid obesity) (HCC) - Primary    - prescribed rybelsus 3mg  tab for patient  - heavily counseled on diet control and we have created a plan to do decreased portion sizes at dinner time and also eat healthy snacks.  - we did discuss exercise, but will work on this at next visit.      Relevant Medications   Semaglutide (RYBELSUS) 3 MG TABS   Semaglutide (RYBELSUS) 3 MG TABS   Other Relevant Orders   Ambulatory referral to Behavioral Health   Binge eating    - have sent referral in for behavioral health to see if this can improve patient relationship with food.      Relevant Orders   Ambulatory referral to Behavioral Health   Other Visit Diagnoses     Metabolic syndrome       Relevant Medications   Semaglutide (RYBELSUS) 3 MG TABS   Semaglutide (RYBELSUS) 3 MG TABS       Return in about 4 weeks (around 07/12/2022).   09/11/2022, DO

## 2022-06-14 NOTE — Assessment & Plan Note (Signed)
-   have sent referral in for behavioral health to see if this can improve patient relationship with food.

## 2022-06-20 ENCOUNTER — Encounter: Payer: Self-pay | Admitting: Professional

## 2022-06-20 ENCOUNTER — Ambulatory Visit (INDEPENDENT_AMBULATORY_CARE_PROVIDER_SITE_OTHER): Payer: 59 | Admitting: Professional

## 2022-06-20 DIAGNOSIS — R632 Polyphagia: Secondary | ICD-10-CM | POA: Diagnosis not present

## 2022-06-20 DIAGNOSIS — F4322 Adjustment disorder with anxiety: Secondary | ICD-10-CM | POA: Diagnosis not present
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
## 2022-06-20 NOTE — Progress Notes (Signed)
° ° ° ° ° ° ° ° ° ° ° ° ° ° °  Claude Swendsen, LCMHC °

## 2022-06-21 ENCOUNTER — Encounter (INDEPENDENT_AMBULATORY_CARE_PROVIDER_SITE_OTHER): Payer: 59 | Admitting: Family Medicine

## 2022-06-21 DIAGNOSIS — K921 Melena: Secondary | ICD-10-CM | POA: Diagnosis not present

## 2022-06-25 NOTE — Telephone Encounter (Signed)
Please see the MyChart message reply(ies) for my assessment and plan.    This patient gave consent for this Medical Advice Message and is aware that it may result in a bill to [employer], as well as the possibility of receiving a bill for a co-payment or deductible. They are an established patient, but are not seeking medical advice exclusively about a problem treated during an in person or video visit in the last seven days. I did not recommend an in person or video visit within seven days of my reply.    I spent a total of 10 minutes cumulative time within 7 days through [employer].  Discussed with patient that it would depend how long she was having blood in stool and if it was dark red blood (more likely hemorrhoids) can try hemorrhoid ointment or if dark blood she needs to go to the ED for further eval. Willing to place referral in or see patient in office for this.   Owens Loffler, DO

## 2022-07-04 ENCOUNTER — Other Ambulatory Visit: Payer: Self-pay | Admitting: Sports Medicine

## 2022-07-04 DIAGNOSIS — M1612 Unilateral primary osteoarthritis, left hip: Secondary | ICD-10-CM

## 2022-07-10 ENCOUNTER — Ambulatory Visit (INDEPENDENT_AMBULATORY_CARE_PROVIDER_SITE_OTHER): Payer: 59 | Admitting: Professional

## 2022-07-10 ENCOUNTER — Encounter: Payer: Self-pay | Admitting: Professional

## 2022-07-10 DIAGNOSIS — F4322 Adjustment disorder with anxiety: Secondary | ICD-10-CM | POA: Diagnosis not present

## 2022-07-10 DIAGNOSIS — R632 Polyphagia: Secondary | ICD-10-CM

## 2022-07-10 NOTE — Progress Notes (Signed)
[facility] Counselor/Therapist Progress Note  Patient ID: Angel Durham, MRN: [id number],    Date: 07/10/2022  Time Spent: 49 minutes 12-1249pm  Treatment Type: Individual Therapy  Risk Assessment: Danger to Self:  No Self-injurious Behavior: No Danger to Others: No  Subjective: This session was held via video teletherapy. The patient consented to video teletherapy and was located in her home during this session. She is aware it is the responsibility of the patient to secure confidentiality on her end of the session. The provider was in a private home office for the duration of this session.    The patient arrived on time for her webex appointment  Issues addressed: 1-homework- completed -using HAALT-B and notices she is usually physically hungry -had a bite of pumpkin cheesecake -she has not opened the [employer] in the freezer  2-therapy resource for daughter -provided phone number for [facility] 3-treatment planning -pt and Clinician completed treatment planning in session -pt participated and agrees with he treatment plan  Treatment Plan Problems Addressed  Eating Disorders And Obesity, Type A Behavior  Goals 1. Achieve an overall decrease in pressured, driven behaviors. Objective Understand the impact that historical thoughts and behaviors and other people's percptions impacted self-esteem. Target Date: 2023-07-10 Frequency: Biweekly  Progress: 0 Modality: individual  2. Alleviate sense of time urgency, free-floating anxiety, anger, and self-destructive behaviors. 3. Develop coping strategies (e.g., feeling identification, problem-solving, assertiveness) to address emotional issues that could lead to relapse of the eating disorder. 4. Develop healthy cognitive patterns and beliefs about self that lead to positive identity and prevent a relapse of the eating disorder. 5. Develop healthy interpersonal relationships that lead to  alleviation and help prevent the relapse of the eating disorder. 6. Develop social and recreational activities as a routine part of life. 7. Formulate and implement a new life attitudinal pattern that allows for a more relaxed pattern of living. Objective Develop a daily routine that reflects a balance between the quest for achievement and appreciation of aesthetic things. Target Date: 2023-07-10 Frequency: Biweekly  Progress: 0 Modality: individual  Related Interventions Assign the client to read "List of Aphorisms" in Treating Type A Behavior and Your Heart by Candie Chroman three times daily for one or two weeks; then to pick several to incorporate into his/her life. Ask the client to list activities he/she could engage in for purely aesthetic enjoyment (e.g., visit an [employer], attend a symphony concert, hike in the woods, take painting lessons, etc.) and incorporate these into his/her life. Objective Describe the pattern of pressured, driven living. Target Date: 2023-07-10 Frequency: Biweekly  Progress: 0 Modality: individual  Related Interventions Assess examples of pressured lifestyle including associated situations, cognition, emotion, actions, and impact on client and others. Objective Verbalize a desire to reprioritize values toward less self-focus, more inner and other orientation. Target Date: 2023-07-10 Frequency: Biweekly  Progress: 0 Modality: individual  Related Interventions Explore and clarify the client's value system and assist in developing new priorities on the importance of relationships, recreation, spiritual growth, reflection time, giving to others (or assign "Developing Noncompetitive Values" in the Adult Psychotherapy Homework Planner by Stephannie Li). Objective Verbalize a commitment to learning new approaches managing self, time, and relationships that emphasize the values of inner and other orientation. Target Date: 2023-07-10 Frequency: Biweekly  Progress: 0  Modality: individual  Related Interventions Ask the client to commit to attempting attitude and behavior changes to promote a healthier, less Type A lifestyle; explore with him/her what changes  need to be made to become less Type A. Objective Decrease the frequency of taking work home. Target Date: 2023-07-10 Frequency: Biweekly  Progress: 0 Modality: individual  Objective Learn and implement respectful assertive communication knowledge and skills to replace insensitive directness or verbal aggression that is controlling. Target Date: 2023-07-10 Frequency: Biweekly  Progress: 0 Modality: individual  Related Interventions Train the client in assertive communication with emphasis on recognizing and refraining from aggressive communication (e.g., ignoring of the rights of others) to respectful, assertive communication. Objective Practice using new calming, cognitive, communication, and problem-solving skills in session with the therapist and during homework exercises. Target Date: 2023-07-10 Frequency: Biweekly  Progress: 0 Modality: individual  Related Interventions Assist the client in constructing a client-tailored strategy for managing pressure that combines any of the somatic, cognitive, communication, problem-solving, and/or conflict resolution skills relevant to his/her needs. Select situations in which the client will be increasingly challenged to apply his/her new strategies for managing stress. Use any of several techniques, including relaxation, imagery, behavioral rehearsal, modeling, role-playing, in vivo exposure, or behavioral experiments to help the client consolidate the use of his/her new stress management skills. Objective Demonstrate decreased impatience with others by talking of appreciating and understanding the good qualities in others. Target Date: 2023-07-10 Frequency: Biweekly  Progress: 0 Modality: individual  Related Interventions Assign the client to talk to an  associate or child, focusing on listening to the other person and learning several good things about that person; process the experience. Objective Decrease the number of hours worked daily and the frequency of taking work home. Target Date: 2022-07-10 Frequency: Daily  Progress: 0 Modality: individual  Related Interventions Review the client's pattern of hours spent working (at home and office) and recommend selected reductions; explore how these reductions could be accomplished (what specifically needs to change?). 8. Reach a balance between work/competitive and social/noncompetitive time in daily life. 9. Restore normal eating patterns, healthy weight maintenance, and a realistic appraisal of body size. Objective State a basis for positive identity that is not based on weight and appearance but on character, traits, relationships, and intrinsic value. Target Date: 2023-07-10 Frequency: Biweekly  Progress: 0 Modality: individual  Related Interventions Assist the client in identifying a basis for self-worth apart from body image by reviewing his/her talents, successes, positive traits, importance to others, and intrinsic spiritual value. Objective Verbalize an understanding of relapse prevention and the distinction between a lapse and a relapse. Target Date: 2023-07-10 Frequency: Biweekly  Progress: 0 Modality: individual  Related Interventions Discuss with the client the distinction between a lapse and relapse, associating a lapse with an initial and reversible return of distress, urges, or to avoid, and relapse with the decision to return to the cycle of maladaptive thoughts and actions (e.g., feeling anxious, binging, then purging). Identify with the client future situations or circumstances in which lapses could occur. Objective Honestly describe the pattern of eating including types, amounts, and frequency of food consumed or hoarded. Target Date: 2023-07-10 Frequency: Biweekly   Progress: 0 Modality: individual  Related Interventions Assess the historical course of the disorder including the amount, type, and pattern of the client's food intake (e.g., too little food, too much food, binge eating, or hoarding food); perceived personal and interpersonal triggers and personal goals. Objective Establish regular eating patterns by eating at regular intervals and consuming optimal daily calories. Target Date: 2023-07-10 Frequency: Biweekly  Progress: 0 Modality: individual  Related Interventions Establish healthy weight goals for the client per the Body Mass Index (BMI),  the [place] Height and Weight Tables, or some other recognized standard. Establish an appropriate daily caloric intake for the client and assist him/her in meal planning. Objective Identify and develop a list of high-risk situations for unhealthy eating. Target Date: 2023-07-10 Frequency: Biweekly  Progress: 0 Modality: individual  Objective Learn and implement skills for managing urges to engage in unhealthy eating. Target Date: 2023-07-10 Frequency: Biweekly  Progress: 0 Modality: individual  Objective Participate in exercises to build skills in managing urges to emotionally eat. Target Date: 2023-07-10 Frequency: Biweekly  Progress: 0 Modality: individual  Objective Implement relapse prevention strategies for managing possible future anxiety symptoms. Target Date: 2023-07-10 Frequency: Biweekly  Progress: 0 Modality: individual  Related Interventions Instruct the client to routinely use strategies learned in therapy (e.g., continued exposure to previous external or internal cues that arise) to prevent relapse. Develop a "maintenance plan" with the client that describes how the client plans to identify challenges, use knowledge and skills learned in therapy to manage them, and maintain positive changes gained in therapy. Objective Identify and develop a list of high-risk situations for  unhealthy eating or weight loss practices. Target Date: 2022-07-10 Frequency: Daily  Progress: 0 Modality: individual  Related Interventions Assess the nature of any external cues (e.g., persons, objects, and situations) and internal cues (thoughts, images, and impulses) that precipitate the client's uncontrolled eating and/or compensatory weight management behaviors. Direct and assist the client in construction of a hierarchy of high-risk internal and external triggers for uncontrolled eating and/or compensatory weight management behaviors. Objective Learn and implement skills for managing urges to engage in unhealthy eating or weight loss practices. Target Date: 2022-07-10 Frequency: Daily  Progress: 0 Modality: individual  Related Interventions Teach the client tailored skills to manage high-risk situations including distraction, positive self-talk, problem-solving, conflict resolution (e.g., empathy, active listening, "I messages," respectful communication, assertiveness without aggression, compromise), or other social/ communication skills; use modeling, role-playing, and behavior rehearsal to work through several current situations. Objective Participate in exercises to build skills in managing urges to use maladaptive weight control practices. Target Date: 2022-07-10 Frequency: Daily  Progress: 0 Modality: individual  Related Interventions Assign homework exercises that allow the client to practice and strengthen skills learned in therapy; select initial high-risk situations that have a high likelihood of being a successful coping experience for the client; prepare and rehearse a plan for managing the risk situation; review/process the real life implementation by the client, reinforcing success while providing corrective feedback toward improvement. 10. Terminate overeating and implement lifestyle changes that lead to weight loss and improved health. 11. Terminate the pattern of binge  eating and purging behavior with a return to eating normal amounts of nutritious foods. 12. Terminate the pattern of binge eating with a return to eating normal amounts of nutritious foods.  Diagnosis:Adjustment disorder with anxiety  Binge eating  Plan:  -continue using HAALT-B -next session is Tuesday, July 24, 2022 at [place]

## 2022-07-10 NOTE — Progress Notes (Signed)
° ° ° ° ° ° ° ° ° ° ° ° ° ° °  Kasson Lamere, LCMHC °

## 2022-07-12 ENCOUNTER — Encounter: Payer: Self-pay | Admitting: Family Medicine

## 2022-07-12 ENCOUNTER — Ambulatory Visit: Payer: 59 | Admitting: Family Medicine

## 2022-07-12 VITALS — BP 126/70 | HR 57 | Ht 66.75 in | Wt 258.0 lb

## 2022-07-12 DIAGNOSIS — Z6841 Body Mass Index (BMI) 40.0 and over, adult: Secondary | ICD-10-CM | POA: Diagnosis not present

## 2022-07-12 DIAGNOSIS — R7303 Prediabetes: Secondary | ICD-10-CM | POA: Diagnosis not present

## 2022-07-12 LAB — POCT GLYCOSYLATED HEMOGLOBIN (HGB A1C): Hemoglobin A1C: 5.8 % — AB (ref 4.0–5.6)

## 2022-07-12 MED ORDER — OZEMPIC (0.25 OR 0.5 MG/DOSE) 2 MG/3ML ~~LOC~~ SOPN: 0.2500 mg | PEN_INJECTOR | SUBCUTANEOUS | 2 refills | Status: DC

## 2022-07-12 MED ORDER — OZEMPIC (0.25 OR 0.5 MG/DOSE) 2 MG/3ML ~~LOC~~ SOPN: 0.2500 mg | PEN_INJECTOR | SUBCUTANEOUS | 0 refills | Status: DC

## 2022-07-12 NOTE — Patient Instructions (Signed)
Ozempic start 0.25mg  once a week for 4 weeks We will meet up at 4 weeks (can be virtual Mychart visit) and discuss efficacy. Then we will go to 0.5mg 

## 2022-07-12 NOTE — Progress Notes (Signed)
Established patient visit   Patient: Angel Durham   DOB: 08/01/1971   51 y.o. Female  MRN: [id number] Visit Date: 07/12/2022  Today's healthcare provider: Owens Loffler, DO   Chief Complaint  Patient presents with   Follow-up    SUBJECTIVE    Chief Complaint  Patient presents with   Follow-up   HPI  Pt presents for follow up on weight management. She was on rybelsus 3mg  taking samples, but was denied by insurance for formal prescription. She has been working on her diet. She has not yet started to exercise.   Review of Systems  Constitutional:  Negative for activity change, fatigue and fever.  Respiratory:  Negative for cough and shortness of breath.   Cardiovascular:  Negative for chest pain.  Gastrointestinal:  Negative for abdominal pain.  Genitourinary:  Negative for difficulty urinating.      Current Meds  Medication Sig   aspirin 81 MG EC tablet Take 81 mg by mouth daily.   celecoxib (CELEBREX) 200 MG capsule One to 2 tablets by mouth daily as needed for pain.   diltiazem (CARDIZEM) 30 MG tablet Take 1 tablet every 4 hours AS NEEDED for heart rate >100   nadolol (CORGARD) 20 MG tablet Take 20 mg by mouth daily.   omeprazole (PRILOSEC) 40 MG capsule Take 40 mg by mouth daily.   ondansetron (ZOFRAN-ODT) 4 MG disintegrating tablet Take by mouth.   Semaglutide,0.25 or 0.5MG /DOS, (OZEMPIC, 0.25 OR 0.5 MG/DOSE,) 2 MG/3ML SOPN Inject 0.25 mg into the skin once a week.   Semaglutide,0.25 or 0.5MG /DOS, (OZEMPIC, 0.25 OR 0.5 MG/DOSE,) 2 MG/3ML SOPN Inject 0.25 mg into the skin once a week.   zolmitriptan (ZOMIG) 5 MG nasal solution Place into the nose as needed for migraine.    OBJECTIVE    BP 126/70   Pulse (!) 57   Ht 5' 6.75" (1.695 m)   Wt 258 lb (117 kg)   BMI 40.71 kg/m   Physical Exam Vitals reviewed.  Constitutional:      Appearance: She is well-developed.  HENT:     Head: Normocephalic and atraumatic.  Eyes:     Conjunctiva/sclera: Conjunctivae  normal.  Cardiovascular:     Rate and Rhythm: Normal rate.  Pulmonary:     Effort: Pulmonary effort is normal.  Skin:    General: Skin is dry.     Coloration: Skin is not pale.  Neurological:     Mental Status: She is alert and oriented to person, place, and time.  Psychiatric:        Behavior: Behavior normal.        ASSESSMENT & PLAN    Problem List Items Addressed This Visit       Other   Obesity with serious comorbidity    - pt has HTN, osteoarthritis as a result of obesity - she has been doing well with decreasing portion sizes  - is currently seeing a therapist for binge eating disorder - discussed increasing protein based snacks to increase satiety.       Relevant Medications   Semaglutide,0.25 or 0.5MG /DOS, (OZEMPIC, 0.25 OR 0.5 MG/DOSE,) 2 MG/3ML SOPN   Semaglutide,0.25 or 0.5MG /DOS, (OZEMPIC, 0.25 OR 0.5 MG/DOSE,) 2 MG/3ML SOPN   Other Relevant Orders   POCT HgB A1C (Completed)   Obesity, Class III, BMI 40-49.9 (morbid obesity) ([place]) - Primary   Relevant Medications   Semaglutide,0.25 or 0.5MG /DOS, (OZEMPIC, 0.25 OR 0.5 MG/DOSE,) 2 MG/3ML SOPN   Semaglutide,0.25 or 0.5MG /DOS, (  OZEMPIC, 0.25 OR 0.5 MG/DOSE,) 2 MG/3ML SOPN   Other Relevant Orders   POCT HgB A1C (Completed)   Prediabetes    - POC A1C done and interpreted by myself as prediabetic at 5.8 - with this new diagnosis will go ahead and start patient on ozempic to help with sugar control and weight loss  - discussed taking it at 0.25mg  once weekly for one month - will follow up in one month at that time and then will titrate to 0.5mg  - given sample in office while we apply for prior authorization  - if ozempic is not approved we should be able to get wegovy approved with her comorbidities of prediabetes, HTN, osteoarthritis.       Relevant Medications   Semaglutide,0.25 or 0.5MG /DOS, (OZEMPIC, 0.25 OR 0.5 MG/DOSE,) 2 MG/3ML SOPN    Return in about 4 weeks (around 08/09/2022). Either in person or on  mychart visit to see how ozempic is working     Meds ordered this encounter  Medications   Semaglutide,0.25 or 0.5MG /DOS, (OZEMPIC, 0.25 OR 0.5 MG/DOSE,) 2 MG/3ML SOPN    Sig: Inject 0.25 mg into the skin once a week.    Dispense:  3 mL    Refill:  2   Semaglutide,0.25 or 0.5MG /DOS, (OZEMPIC, 0.25 OR 0.5 MG/DOSE,) 2 MG/3ML SOPN    Sig: Inject 0.25 mg into the skin once a week.    Dispense:  3 mL    Refill:  0    Orders Placed This Encounter  Procedures   POCT HgB A1C     Charlton Amor, DO  [facility] [phone] (phone) [phone] (fax)  [facility]

## 2022-07-12 NOTE — Assessment & Plan Note (Addendum)
-   pt has HTN, osteoarthritis as a result of obesity - she has been doing well with decreasing portion sizes  - is currently seeing a therapist for binge eating disorder - discussed increasing protein based snacks to increase satiety.

## 2022-07-12 NOTE — Assessment & Plan Note (Signed)
-   POC A1C done and interpreted by myself as prediabetic at 5.8 - with this new diagnosis will go ahead and start patient on ozempic to help with sugar control and weight loss  - discussed taking it at 0.25mg  once weekly for one month - will follow up in one month at that time and then will titrate to 0.5mg  - given sample in office while we apply for prior authorization  - if ozempic is not approved we should be able to get wegovy approved with her comorbidities of prediabetes, HTN, osteoarthritis.

## 2022-07-18 ENCOUNTER — Encounter: Payer: Self-pay | Admitting: Family Medicine

## 2022-07-19 ENCOUNTER — Other Ambulatory Visit: Payer: Self-pay | Admitting: Family Medicine

## 2022-07-19 ENCOUNTER — Telehealth: Payer: Self-pay

## 2022-07-19 MED ORDER — WEGOVY 0.25 MG/0.5ML ~~LOC~~ SOAJ: 0.2500 mg | SUBCUTANEOUS | 0 refills | Status: DC

## 2022-07-19 NOTE — Telephone Encounter (Signed)
Initiated Prior authorization XEN:[id number] (0.25 or 0.5 MG/DOSE) 2MG /3ML pen-injectors Via: Covermymeds Case/Key:[id number] Status: Pending as of 07/19/22 Reason: Notified Pt via: Mychart

## 2022-07-19 NOTE — Telephone Encounter (Addendum)
Initiated Prior authorization EHU:[id number] (0.25 or 0.5 MG/DOSE) 2MG /3ML pen-injectors Via: Covermymeds Case/Key:[id number] Status: denied as of 07/19/22 Reason:*Drug Not Covered/Plan Exclusion - Your request for coverage was denied because your prescription benefit plan does not cover the requested medication.  Notified Pt via: Mychart   Initiated Prior authorization OVZ:CHYIFO 0.25MG /0.5ML auto-injectors Via: Covermymeds Case/Key:[id number] Status: denied as of 07/19/22 Reason:*Drug Not Covered/Plan Exclusion - Your request for coverage was denied because your prescription benefit plan does not cover the requested medication.  Notified Pt via: Mychart

## 2022-07-24 ENCOUNTER — Ambulatory Visit (INDEPENDENT_AMBULATORY_CARE_PROVIDER_SITE_OTHER): Payer: 59 | Admitting: Professional

## 2022-07-24 ENCOUNTER — Encounter: Payer: Self-pay | Admitting: Professional

## 2022-07-24 DIAGNOSIS — F4322 Adjustment disorder with anxiety: Secondary | ICD-10-CM

## 2022-07-24 DIAGNOSIS — R632 Polyphagia: Secondary | ICD-10-CM | POA: Diagnosis not present

## 2022-07-24 NOTE — Progress Notes (Addendum)
[facility] Counselor/Therapist Progress Note  Patient ID: Angel Durham, MRN: [id number],    Date: 07/24/2022  Time Spent: 47 minutes 1-147pm  Treatment Type: Individual Therapy  Risk Assessment: Danger to Self:  No Self-injurious Behavior: No Danger to Others: No  Subjective: This session was held via video teletherapy. The patient consented to video teletherapy and was located in her home during this session. She is aware it is the responsibility of the patient to secure confidentiality on her end of the session. The provider was in a private home office for the duration of this session.    The patient arrived on time for her webex appointment  Issues addressed: 1-homework- completed -continue using HAALT-B 2-eating -pt is resisting but is struggling with the binging -she is going for a large apple, almonds -pt is intentionally choosing healthy options -she is eating on a schedule   -generally eats at 10 but is still hungry and tends to eat more -she notices that there are times when she is hungry but it is before her normal eating time -on Saturday pt had a family event   -she gave herself a generous piece of cake but it is still on the counter   -pt talks herself over not indulging since it has been six weeks -dinner generally holds her and she is not hungry -she is mindful of not eating too much during the day because she enjoys her meals -pt is trying to slow down how quickly she is eating -how to get her spouse to invest 3-marital -husband is relaxed and pt makes the decisions for the family -pt is the one who engages in relationships -he does not participate in relationships and is not a very emotional person -he is not affectionate and pt gave up years ago -discussed importance of relationship with spouse   -pt's investment in daughter was her main focus and she has gone away to school   -pt is still focused on her as daughter is struggling being  away   -suggested pt needed to consider re-establishing her relationship with her spouse     -pt admits to seeing the need for this to occur as they have grown apart  Treatment Plan Problems Addressed  Eating Disorders And Obesity, Type A Behavior  Goals 1. Achieve an overall decrease in pressured, driven behaviors. Objective Understand the impact that historical thoughts and behaviors and other people's percptions impacted self-esteem. Target Date: 2023-07-10 Frequency: Biweekly  Progress: 0 Modality: individual  2. Alleviate sense of time urgency, free-floating anxiety, anger, and self-destructive behaviors. 3. Develop coping strategies (e.g., feeling identification, problem-solving, assertiveness) to address emotional issues that could lead to relapse of the eating disorder. 4. Develop healthy cognitive patterns and beliefs about self that lead to positive identity and prevent a relapse of the eating disorder. 5. Develop healthy interpersonal relationships that lead to alleviation and help prevent the relapse of the eating disorder. 6. Develop social and recreational activities as a routine part of life. 7. Formulate and implement a new life attitudinal pattern that allows for a more relaxed pattern of living. Objective Develop a daily routine that reflects a balance between the quest for achievement and appreciation of aesthetic things. Target Date: 2023-07-10 Frequency: Biweekly  Progress: 0 Modality: individual  Related Interventions Assign the client to read "List of Aphorisms" in Treating Type A Behavior and Your Heart by Johann Capers three times daily for one or two weeks; then to pick several to incorporate into his/her  life. Ask the client to list activities he/she could engage in for purely aesthetic enjoyment (e.g., visit an Chiropractor, attend a symphony concert, hike in the woods, take painting lessons, etc.) and incorporate these into his/her life. Objective Describe  the pattern of pressured, driven living. Target Date: 2023-07-10 Frequency: Biweekly  Progress: 0 Modality: individual  Related Interventions Assess examples of pressured lifestyle including associated situations, cognition, emotion, actions, and impact on client and others. Objective Verbalize a desire to reprioritize values toward less self-focus, more inner and other orientation. Target Date: 2023-07-10 Frequency: Biweekly  Progress: 0 Modality: individual  Related Interventions Explore and clarify the client's value system and assist in developing new priorities on the importance of relationships, recreation, spiritual growth, reflection time, giving to others (or assign "Developing Noncompetitive Values" in the Adult Psychotherapy Homework Planner by Stephannie Li). Objective Verbalize a commitment to learning new approaches managing self, time, and relationships that emphasize the values of inner and other orientation. Target Date: 2023-07-10 Frequency: Biweekly  Progress: 0 Modality: individual  Related Interventions Ask the client to commit to attempting attitude and behavior changes to promote a healthier, less Type A lifestyle; explore with him/her what changes need to be made to become less Type A. Objective Decrease the frequency of taking work home. Target Date: 2023-07-10 Frequency: Biweekly  Progress: 0 Modality: individual  Objective Learn and implement respectful assertive communication knowledge and skills to replace insensitive directness or verbal aggression that is controlling. Target Date: 2023-07-10 Frequency: Biweekly  Progress: 0 Modality: individual  Related Interventions Train the client in assertive communication with emphasis on recognizing and refraining from aggressive communication (e.g., ignoring of the rights of others) to respectful, assertive communication. Objective Practice using new calming, cognitive, communication, and problem-solving skills in session  with the therapist and during homework exercises. Target Date: 2023-07-10 Frequency: Biweekly  Progress: 0 Modality: individual  Related Interventions Assist the client in constructing a client-tailored strategy for managing pressure that combines any of the somatic, cognitive, communication, problem-solving, and/or conflict resolution skills relevant to his/her needs. Select situations in which the client will be increasingly challenged to apply his/her new strategies for managing stress. Use any of several techniques, including relaxation, imagery, behavioral rehearsal, modeling, role-playing, in vivo exposure, or behavioral experiments to help the client consolidate the use of his/her new stress management skills. Objective Demonstrate decreased impatience with others by talking of appreciating and understanding the good qualities in others. Target Date: 2023-07-10 Frequency: Biweekly  Progress: 0 Modality: individual  Related Interventions Assign the client to talk to an associate or child, focusing on listening to the other person and learning several good things about that person; process the experience. Objective Decrease the number of hours worked daily and the frequency of taking work home. Target Date: 2023-07-10 Frequency: Daily  Progress: 0 Modality: individual  Related Interventions Review the client's pattern of hours spent working (at home and office) and recommend selected reductions; explore how these reductions could be accomplished (what specifically needs to change?). 8. Reach a balance between work/competitive and social/noncompetitive time in daily life. 9. Restore normal eating patterns, healthy weight maintenance, and a realistic appraisal of body size. Objective State a basis for positive identity that is not based on weight and appearance but on character, traits, relationships, and intrinsic value. Target Date: 2023-07-10 Frequency: Biweekly  Progress: 0 Modality:  individual  Related Interventions Assist the client in identifying a basis for self-worth apart from body image by reviewing his/her talents, successes, positive traits, importance to  others, and intrinsic spiritual value. Objective Verbalize an understanding of relapse prevention and the distinction between a lapse and a relapse. Target Date: 2023-07-10 Frequency: Biweekly  Progress: 0 Modality: individual  Related Interventions Discuss with the client the distinction between a lapse and relapse, associating a lapse with an initial and reversible return of distress, urges, or to avoid, and relapse with the decision to return to the cycle of maladaptive thoughts and actions (e.g., feeling anxious, binging, then purging). Identify with the client future situations or circumstances in which lapses could occur. Objective Honestly describe the pattern of eating including types, amounts, and frequency of food consumed or hoarded. Target Date: 2023-07-10 Frequency: Biweekly  Progress: 0 Modality: individual  Related Interventions Assess the historical course of the disorder including the amount, type, and pattern of the client's food intake (e.g., too little food, too much food, binge eating, or hoarding food); perceived personal and interpersonal triggers and personal goals. Objective Establish regular eating patterns by eating at regular intervals and consuming optimal daily calories. Target Date: 2023-07-10 Frequency: Biweekly  Progress: 0 Modality: individual  Related Interventions Establish healthy weight goals for the client per the Body Mass Index (BMI), the [place] Height and Weight Tables, or some other recognized standard. Establish an appropriate daily caloric intake for the client and assist him/her in meal planning. Objective Identify and develop a list of high-risk situations for unhealthy eating. Target Date: 2023-07-10 Frequency: Biweekly  Progress: 0 Modality: individual   Objective Learn and implement skills for managing urges to engage in unhealthy eating. Target Date: 2023-07-10 Frequency: Biweekly  Progress: 0 Modality: individual  Objective Participate in exercises to build skills in managing urges to emotionally eat. Target Date: 2023-07-10 Frequency: Biweekly  Progress: 0 Modality: individual  Objective Implement relapse prevention strategies for managing possible future anxiety symptoms. Target Date: 2023-07-10 Frequency: Biweekly  Progress: 0 Modality: individual  Related Interventions Instruct the client to routinely use strategies learned in therapy (e.g., continued exposure to previous external or internal cues that arise) to prevent relapse. Develop a "maintenance plan" with the client that describes how the client plans to identify challenges, use knowledge and skills learned in therapy to manage them, and maintain positive changes gained in therapy. Objective Identify and develop a list of high-risk situations for unhealthy eating or weight loss practices. Target Date: 2023-07-10 Frequency: Daily  Progress: 0 Modality: individual  Related Interventions Assess the nature of any external cues (e.g., persons, objects, and situations) and internal cues (thoughts, images, and impulses) that precipitate the client's uncontrolled eating and/or compensatory weight management behaviors. Direct and assist the client in construction of a hierarchy of high-risk internal and external triggers for uncontrolled eating and/or compensatory weight management behaviors. Objective Learn and implement skills for managing urges to engage in unhealthy eating or weight loss practices. Target Date: 2023-07-10 Frequency: Daily  Progress: 0 Modality: individual  Related Interventions Teach the client tailored skills to manage high-risk situations including distraction, positive self-talk, problem-solving, conflict resolution (e.g., empathy, active listening, "I  messages," respectful communication, assertiveness without aggression, compromise), or other social/ communication skills; use modeling, role-playing, and behavior rehearsal to work through several current situations. Objective Participate in exercises to build skills in managing urges to use maladaptive weight control practices. Target Date: 2023-07-10 Frequency: Daily  Progress: 0 Modality: individual  Related Interventions Assign homework exercises that allow the client to practice and strengthen skills learned in therapy; select initial high-risk situations that have a high likelihood of being a successful coping experience  for the client; prepare and rehearse a plan for managing the risk situation; review/process the real life implementation by the client, reinforcing success while providing corrective feedback toward improvement. 10. Terminate overeating and implement lifestyle changes that lead to weight loss and improved health. 11. Terminate the pattern of binge eating and purging behavior with a return to eating normal amounts of nutritious foods. 12. Terminate the pattern of binge eating with a return to eating normal amounts of nutritious foods.  Diagnosis:Adjustment disorder with anxiety  Binge eating  Plan:  -consider referral to [facility]. -next session is Tuesday, August 07, 2022 at 12pm.

## 2022-08-01 ENCOUNTER — Encounter: Payer: Self-pay | Admitting: Cardiovascular Disease

## 2022-08-07 ENCOUNTER — Ambulatory Visit (INDEPENDENT_AMBULATORY_CARE_PROVIDER_SITE_OTHER): Payer: 59 | Admitting: Professional

## 2022-08-07 ENCOUNTER — Encounter: Payer: Self-pay | Admitting: Professional

## 2022-08-07 DIAGNOSIS — F4322 Adjustment disorder with anxiety: Secondary | ICD-10-CM

## 2022-08-07 DIAGNOSIS — R632 Polyphagia: Secondary | ICD-10-CM

## 2022-08-07 NOTE — Progress Notes (Signed)
[place] Counselor/Therapist Progress Note  Patient ID: Angel Durham, MRN: [id number],    Date: 08/07/2022  Time Spent: 45 minutes 1201-1246pm  Treatment Type: Individual Therapy  Risk Assessment: Danger to Self:  No Self-injurious Behavior: No Danger to Others: No  Subjective: This session was held via video teletherapy. The patient consented to video teletherapy and was located in her home during this session. She is aware it is the responsibility of the patient to secure confidentiality on her end of the session. The provider was in a private home office for the duration of this session.    The patient arrived on time for her webex appointment  Issues addressed: 1-homework- completed -consider referral to [facility]. 2-eating -pt has lost 18 lbs   -notices that her foot is not as painful -pt feels pleased with her progress -pt is being intentional and mindful of hunger signals and quantity -making choices that include protein to assist with satiation -over-consumed at a weekend family dinner   -was hungry later in the evening but waiting 3-physical -still struggling with hip pain 4-daughter -doing better -concerned about going back after Christmas  Treatment Plan Problems Addressed  Eating Disorders And Obesity, Type A Behavior  Goals 1. Achieve an overall decrease in pressured, driven behaviors. Objective Understand the impact that historical thoughts and behaviors and other people's percptions impacted self-esteem. Target Date: 2023-07-10 Frequency: Biweekly  Progress: 0 Modality: individual  2. Alleviate sense of time urgency, free-floating anxiety, anger, and self-destructive behaviors. 3. Develop coping strategies (e.g., feeling identification, problem-solving, assertiveness) to address emotional issues that could lead to relapse of the eating disorder. 4. Develop healthy cognitive patterns and beliefs about self that lead  to positive identity and prevent a relapse of the eating disorder. 5. Develop healthy interpersonal relationships that lead to alleviation and help prevent the relapse of the eating disorder. 6. Develop social and recreational activities as a routine part of life. 7. Formulate and implement a new life attitudinal pattern that allows for a more relaxed pattern of living. Objective Develop a daily routine that reflects a balance between the quest for achievement and appreciation of aesthetic things. Target Date: 2023-07-10 Frequency: Biweekly  Progress: 0 Modality: individual  Related Interventions Assign the client to read "List of Aphorisms" in Treating Type A Behavior and Your Heart by Johann Capers three times daily for one or two weeks; then to pick several to incorporate into his/her life. Ask the client to list activities he/she could engage in for purely aesthetic enjoyment (e.g., visit an [employer], attend a symphony concert, hike in the woods, take painting lessons, etc.) and incorporate these into his/her life. Objective Describe the pattern of pressured, driven living. Target Date: 2023-07-10 Frequency: Biweekly  Progress: 0 Modality: individual  Related Interventions Assess examples of pressured lifestyle including associated situations, cognition, emotion, actions, and impact on client and others. Objective Verbalize a desire to reprioritize values toward less self-focus, more inner and other orientation. Target Date: 2023-07-10 Frequency: Biweekly  Progress: 0 Modality: individual  Related Interventions Explore and clarify the client's value system and assist in developing new priorities on the importance of relationships, recreation, spiritual growth, reflection time, giving to others (or assign "Developing Noncompetitive Values" in the Adult Psychotherapy Homework Planner by Bryn Gulling). Objective Verbalize a commitment to learning new approaches managing self, time, and  relationships that emphasize the values of inner and other orientation. Target Date: 2023-07-10 Frequency: Biweekly  Progress: 0 Modality: individual  Related Interventions  Ask the client to commit to attempting attitude and behavior changes to promote a healthier, less Type A lifestyle; explore with him/her what changes need to be made to become less Type A. Objective Decrease the frequency of taking work home. Target Date: 2023-07-10 Frequency: Biweekly  Progress: 0 Modality: individual  Objective Learn and implement respectful assertive communication knowledge and skills to replace insensitive directness or verbal aggression that is controlling. Target Date: 2023-07-10 Frequency: Biweekly  Progress: 0 Modality: individual  Related Interventions Train the client in assertive communication with emphasis on recognizing and refraining from aggressive communication (e.g., ignoring of the rights of others) to respectful, assertive communication. Objective Practice using new calming, cognitive, communication, and problem-solving skills in session with the therapist and during homework exercises. Target Date: 2023-07-10 Frequency: Biweekly  Progress: 0 Modality: individual  Related Interventions Assist the client in constructing a client-tailored strategy for managing pressure that combines any of the somatic, cognitive, communication, problem-solving, and/or conflict resolution skills relevant to his/her needs. Select situations in which the client will be increasingly challenged to apply his/her new strategies for managing stress. Use any of several techniques, including relaxation, imagery, behavioral rehearsal, modeling, role-playing, in vivo exposure, or behavioral experiments to help the client consolidate the use of his/her new stress management skills. Objective Demonstrate decreased impatience with others by talking of appreciating and understanding the good qualities in others. Target  Date: 2023-07-10 Frequency: Biweekly  Progress: 0 Modality: individual  Related Interventions Assign the client to talk to an associate or child, focusing on listening to the other person and learning several good things about that person; process the experience. Objective Decrease the number of hours worked daily and the frequency of taking work home. Target Date: 2023-07-10 Frequency: Daily  Progress: 0 Modality: individual  Related Interventions Review the client's pattern of hours spent working (at home and office) and recommend selected reductions; explore how these reductions could be accomplished (what specifically needs to change?). 8. Reach a balance between work/competitive and social/noncompetitive time in daily life. 9. Restore normal eating patterns, healthy weight maintenance, and a realistic appraisal of body size. Objective State a basis for positive identity that is not based on weight and appearance but on character, traits, relationships, and intrinsic value. Target Date: 2023-07-10 Frequency: Biweekly  Progress: 0 Modality: individual  Related Interventions Assist the client in identifying a basis for self-worth apart from body image by reviewing his/her talents, successes, positive traits, importance to others, and intrinsic spiritual value. Objective Verbalize an understanding of relapse prevention and the distinction between a lapse and a relapse. Target Date: 2023-07-10 Frequency: Biweekly  Progress: 0 Modality: individual  Related Interventions Discuss with the client the distinction between a lapse and relapse, associating a lapse with an initial and reversible return of distress, urges, or to avoid, and relapse with the decision to return to the cycle of maladaptive thoughts and actions (e.g., feeling anxious, binging, then purging). Identify with the client future situations or circumstances in which lapses could occur. Objective Honestly describe the pattern of  eating including types, amounts, and frequency of food consumed or hoarded. Target Date: 2023-07-10 Frequency: Biweekly  Progress: 0 Modality: individual  Related Interventions Assess the historical course of the disorder including the amount, type, and pattern of the client's food intake (e.g., too little food, too much food, binge eating, or hoarding food); perceived personal and interpersonal triggers and personal goals. Objective Establish regular eating patterns by eating at regular intervals and consuming optimal daily calories. Target Date:  2023-07-10 Frequency: Biweekly  Progress: 0 Modality: individual  Related Interventions Establish healthy weight goals for the client per the Body Mass Index (BMI), the [place] Height and Weight Tables, or some other recognized standard. Establish an appropriate daily caloric intake for the client and assist him/her in meal planning. Objective Identify and develop a list of high-risk situations for unhealthy eating. Target Date: 2023-07-10 Frequency: Biweekly  Progress: 0 Modality: individual  Objective Learn and implement skills for managing urges to engage in unhealthy eating. Target Date: 2023-07-10 Frequency: Biweekly  Progress: 0 Modality: individual  Objective Participate in exercises to build skills in managing urges to emotionally eat. Target Date: 2023-07-10 Frequency: Biweekly  Progress: 0 Modality: individual  Objective Implement relapse prevention strategies for managing possible future anxiety symptoms. Target Date: 2023-07-10 Frequency: Biweekly  Progress: 0 Modality: individual  Related Interventions Instruct the client to routinely use strategies learned in therapy (e.g., continued exposure to previous external or internal cues that arise) to prevent relapse. Develop a "maintenance plan" with the client that describes how the client plans to identify challenges, use knowledge and skills learned in therapy to manage them,  and maintain positive changes gained in therapy. Objective Identify and develop a list of high-risk situations for unhealthy eating or weight loss practices. Target Date: 2023-07-10 Frequency: Daily  Progress: 0 Modality: individual  Related Interventions Assess the nature of any external cues (e.g., persons, objects, and situations) and internal cues (thoughts, images, and impulses) that precipitate the client's uncontrolled eating and/or compensatory weight management behaviors. Direct and assist the client in construction of a hierarchy of high-risk internal and external triggers for uncontrolled eating and/or compensatory weight management behaviors. Objective Learn and implement skills for managing urges to engage in unhealthy eating or weight loss practices. Target Date: 2023-07-10 Frequency: Daily  Progress: 0 Modality: individual  Related Interventions Teach the client tailored skills to manage high-risk situations including distraction, positive self-talk, problem-solving, conflict resolution (e.g., empathy, active listening, "I messages," respectful communication, assertiveness without aggression, compromise), or other social/ communication skills; use modeling, role-playing, and behavior rehearsal to work through several current situations. Objective Participate in exercises to build skills in managing urges to use maladaptive weight control practices. Target Date: 2023-07-10 Frequency: Daily  Progress: 0 Modality: individual  Related Interventions Assign homework exercises that allow the client to practice and strengthen skills learned in therapy; select initial high-risk situations that have a high likelihood of being a successful coping experience for the client; prepare and rehearse a plan for managing the risk situation; review/process the real life implementation by the client, reinforcing success while providing corrective feedback toward improvement. 10. Terminate overeating  and implement lifestyle changes that lead to weight loss and improved health. 11. Terminate the pattern of binge eating and purging behavior with a return to eating normal amounts of nutritious foods. 12. Terminate the pattern of binge eating with a return to eating normal amounts of nutritious foods.  Diagnosis:Adjustment disorder with anxiety  Binge eating  Plan:  -put yourself first -walk driveway at least 3 times per day -next session is Tuesday, August 21, 2022 at 12pm.

## 2022-08-09 ENCOUNTER — Telehealth: Payer: 59 | Admitting: Family Medicine

## 2022-08-21 ENCOUNTER — Ambulatory Visit: Payer: 59 | Admitting: Professional

## 2022-08-30 ENCOUNTER — Other Ambulatory Visit: Payer: Self-pay

## 2022-08-30 DIAGNOSIS — M1612 Unilateral primary osteoarthritis, left hip: Secondary | ICD-10-CM

## 2022-08-30 MED ORDER — CELECOXIB 200 MG PO CAPS: ORAL_CAPSULE | ORAL | 2 refills | Status: DC

## 2022-09-04 ENCOUNTER — Encounter: Payer: Self-pay | Admitting: Professional

## 2022-09-04 ENCOUNTER — Ambulatory Visit (INDEPENDENT_AMBULATORY_CARE_PROVIDER_SITE_OTHER): Payer: 59 | Admitting: Professional

## 2022-09-04 DIAGNOSIS — R632 Polyphagia: Secondary | ICD-10-CM | POA: Diagnosis not present

## 2022-09-04 DIAGNOSIS — F4322 Adjustment disorder with anxiety: Secondary | ICD-10-CM

## 2022-09-04 NOTE — Progress Notes (Signed)
[facility] Counselor/Therapist Progress Note  Patient ID: Angel Durham, MRN: [id number],    Date: 09/04/2022  Time Spent: 45 minutes 12-1248pm  Treatment Type: Individual Therapy  Risk Assessment: Danger to Self:  No Self-injurious Behavior: No Danger to Others: No  Subjective: This session was held via video teletherapy. The patient consented to video teletherapy and was located in her home during this session. She is aware it is the responsibility of the patient to secure confidentiality on her end of the session. The provider was in a private home office for the duration of this session.    The patient arrived on time for her webex appointment  Issues addressed: 1-homework- completed a-put yourself first b-walk driveway at least 3 times per day -pt did not do this but did walk [place] with her dog at least one time per day 2-eating a-pt has lost 1 lbs since before Thanksgiving b-work sponsors [place] -competition, keeping track of steps and if maintaining or losing weight you are entered in for prizes -helps with accountability, this is the first time she has joined for the first time in twenty years c-non-scale victories 3-physical -hip pain was not as tremendous -down a size in shirt and jeans 4-daughter a-planning for daughter's birthday b-daughter has had some "slippage" with her mood at school c-there was some tension between the pt and her daughter -her daughter left on Thursday to go see bf in [place] and did not return until Sunday to leave for school -discussed Christmas plans and pt was pleasantly surprised -pt appears to be grieving the loss of her role as mom of child -discussed role shift as she now has emerging adult  d-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 Achieve an overall decrease in pressured, driven behaviors. Objective Understand the impact that historical thoughts and behaviors and other people's percptions impacted self-esteem. Target Date: 2023-07-10 Frequency: Biweekly  Progress: 0 Modality: individual  2. Alleviate sense of time urgency, free-floating anxiety, anger, and self-destructive behaviors. 3. Develop coping strategies (e.g., feeling identification, problem-solving, assertiveness) to address emotional issues that could lead to relapse of the eating disorder. 4. Develop healthy cognitive patterns and beliefs about self that lead to positive identity and prevent a relapse of the eating disorder. 5. Develop healthy interpersonal relationships that lead to alleviation and help prevent the relapse of the eating disorder. 6. Develop social and recreational activities as a routine part of life. 7. Formulate and implement a new life attitudinal pattern that allows for a more relaxed pattern of living. Objective Develop a daily routine that reflects a balance between the quest for achievement and appreciation of aesthetic things. Target Date: 2023-07-10 Frequency: Biweekly  Progress: 0 Modality: individual  Related Interventions Assign the client to read "List of Aphorisms" in Treating Type A Behavior and Your Heart by Johann Capers three times daily for one or two weeks; then to pick several to incorporate into his/her life. Ask the client to list activities he/she could engage in for purely aesthetic enjoyment (e.g., visit an [employer], attend a symphony concert, hike in the woods, take painting lessons, etc.) and incorporate these into his/her life. Objective Describe the pattern of pressured, driven living. Target Date: 2023-07-10 Frequency: Biweekly  Progress: 0 Modality: individual  Related Interventions Assess examples of  pressured lifestyle including associated situations, cognition, emotion, actions,  and impact on client and others. Objective Verbalize a desire to reprioritize values toward less self-focus, more inner and other orientation. Target Date: 2023-07-10 Frequency: Biweekly  Progress: 0 Modality: individual  Related Interventions Explore and clarify the client's value system and assist in developing new priorities on the importance of relationships, recreation, spiritual growth, reflection time, giving to others (or assign "Developing Noncompetitive Values" in the Adult Psychotherapy Homework Planner by Stephannie Li). Objective Verbalize a commitment to learning new approaches managing self, time, and relationships that emphasize the values of inner and other orientation. Target Date: 2023-07-10 Frequency: Biweekly  Progress: 0 Modality: individual  Related Interventions Ask the client to commit to attempting attitude and behavior changes to promote a healthier, less Type A lifestyle; explore with him/her what changes need to be made to become less Type A. Objective Decrease the frequency of taking work home. Target Date: 2023-07-10 Frequency: Biweekly  Progress: 0 Modality: individual  Objective Learn and implement respectful assertive communication knowledge and skills to replace insensitive directness or verbal aggression that is controlling. Target Date: 2023-07-10 Frequency: Biweekly  Progress: 0 Modality: individual  Related Interventions Train the client in assertive communication with emphasis on recognizing and refraining from aggressive communication (e.g., ignoring of the rights of others) to respectful, assertive communication. Objective Practice using new calming, cognitive, communication, and problem-solving skills in session with the therapist and during homework exercises. Target Date: 2023-07-10 Frequency: Biweekly  Progress: 0 Modality: individual  Related Interventions Assist the client in constructing a client-tailored strategy for managing pressure that  combines any of the somatic, cognitive, communication, problem-solving, and/or conflict resolution skills relevant to his/her needs. Select situations in which the client will be increasingly challenged to apply his/her new strategies for managing stress. Use any of several techniques, including relaxation, imagery, behavioral rehearsal, modeling, role-playing, in vivo exposure, or behavioral experiments to help the client consolidate the use of his/her new stress management skills. Objective Demonstrate decreased impatience with others by talking of appreciating and understanding the good qualities in others. Target Date: 2023-07-10 Frequency: Biweekly  Progress: 0 Modality: individual  Related Interventions Assign the client to talk to an associate or child, focusing on listening to the other person and learning several good things about that person; process the experience. Objective Decrease the number of hours worked daily and the frequency of taking work home. Target Date: 2023-07-10 Frequency: Daily  Progress: 0 Modality: individual  Related Interventions Review the client's pattern of hours spent working (at home and office) and recommend selected reductions; explore how these reductions could be accomplished (what specifically needs to change?). 8. Reach a balance between work/competitive and social/noncompetitive time in daily life. 9. Restore normal eating patterns, healthy weight maintenance, and a realistic appraisal of body size. Objective State a basis for positive identity that is not based on weight and appearance but on character, traits, relationships, and intrinsic value. Target Date: 2023-07-10 Frequency: Biweekly  Progress: 0 Modality: individual  Related Interventions Assist the client in identifying a basis for self-worth apart from body image by reviewing his/her talents, successes, positive traits, importance to others, and intrinsic spiritual  value. Objective Verbalize an understanding of relapse prevention and the distinction between a lapse and a relapse. Target Date: 2023-07-10 Frequency: Biweekly  Progress: 0 Modality: individual  Related Interventions Discuss with the client the distinction between a lapse and relapse, associating a lapse with an initial and reversible return of distress, urges, or to avoid, and relapse  with the decision to return to the cycle of maladaptive thoughts and actions (e.g., feeling anxious, binging, then purging). Identify with the client future situations or circumstances in which lapses could occur. Objective Honestly describe the pattern of eating including types, amounts, and frequency of food consumed or hoarded. Target Date: 2023-07-10 Frequency: Biweekly  Progress: 0 Modality: individual  Related Interventions Assess the historical course of the disorder including the amount, type, and pattern of the client's food intake (e.g., too little food, too much food, binge eating, or hoarding food); perceived personal and interpersonal triggers and personal goals. Objective Establish regular eating patterns by eating at regular intervals and consuming optimal daily calories. Target Date: 2023-07-10 Frequency: Biweekly  Progress: 0 Modality: individual  Related Interventions Establish healthy weight goals for the client per the Body Mass Index (BMI), the [place] Height and Weight Tables, or some other recognized standard. Establish an appropriate daily caloric intake for the client and assist him/her in meal planning. Objective Identify and develop a list of high-risk situations for unhealthy eating. Target Date: 2023-07-10 Frequency: Biweekly  Progress: 0 Modality: individual  Objective Learn and implement skills for managing urges to engage in unhealthy eating. Target Date: 2023-07-10 Frequency: Biweekly  Progress: 0 Modality: individual  Objective Participate in exercises to build  skills in managing urges to emotionally eat. Target Date: 2023-07-10 Frequency: Biweekly  Progress: 0 Modality: individual  Objective Implement relapse prevention strategies for managing possible future anxiety symptoms. Target Date: 2023-07-10 Frequency: Biweekly  Progress: 0 Modality: individual  Related Interventions Instruct the client to routinely use strategies learned in therapy (e.g., continued exposure to previous external or internal cues that arise) to prevent relapse. Develop a "maintenance plan" with the client that describes how the client plans to identify challenges, use knowledge and skills learned in therapy to manage them, and maintain positive changes gained in therapy. Objective Identify and develop a list of high-risk situations for unhealthy eating or weight loss practices. Target Date: 2023-07-10 Frequency: Daily  Progress: 0 Modality: individual  Related Interventions Assess the nature of any external cues (e.g., persons, objects, and situations) and internal cues (thoughts, images, and impulses) that precipitate the client's uncontrolled eating and/or compensatory weight management behaviors. Direct and assist the client in construction of a hierarchy of high-risk internal and external triggers for uncontrolled eating and/or compensatory weight management behaviors. Objective Learn and implement skills for managing urges to engage in unhealthy eating or weight loss practices. Target Date: 2023-07-10 Frequency: Daily  Progress: 0 Modality: individual  Related Interventions Teach the client tailored skills to manage high-risk situations including distraction, positive self-talk, problem-solving, conflict resolution (e.g., empathy, active listening, "I messages," respectful communication, assertiveness without aggression, compromise), or other social/ communication skills; use modeling, role-playing, and behavior rehearsal to work through several current  situations. Objective Participate in exercises to build skills in managing urges to use maladaptive weight control practices. Target Date: 2023-07-10 Frequency: Daily  Progress: 0 Modality: individual  Related Interventions Assign homework exercises that allow the client to practice and strengthen skills learned in therapy; select initial high-risk situations that have a high likelihood of being a successful coping experience for the client; prepare and rehearse a plan for managing the risk situation; review/process the real life implementation by the client, reinforcing success while providing corrective feedback toward improvement. 10. Terminate overeating and implement lifestyle changes that lead to weight loss and improved health. 11. Terminate the pattern of binge eating and purging behavior with a return to eating normal amounts of  nutritious foods. 12. Terminate the pattern of binge eating with a return to eating normal amounts of nutritious foods.  Diagnosis:Adjustment disorder with anxiety  Binge eating  Plan:  -staying on track, using HAALT-B, continued increased activity level -make month with daughter pleasurable without feelings of grief   -read Emerging Adult articles -next session is Tuesday, September 18, 2022 at 12pm.

## 2022-09-18 ENCOUNTER — Ambulatory Visit: Payer: 59 | Admitting: Professional

## 2022-10-05 ENCOUNTER — Ambulatory Visit (INDEPENDENT_AMBULATORY_CARE_PROVIDER_SITE_OTHER): Payer: 59 | Admitting: Professional

## 2022-10-05 ENCOUNTER — Encounter: Payer: Self-pay | Admitting: Professional

## 2022-10-05 DIAGNOSIS — R632 Polyphagia: Secondary | ICD-10-CM | POA: Diagnosis not present

## 2022-10-05 DIAGNOSIS — F4322 Adjustment disorder with anxiety: Secondary | ICD-10-CM

## 2022-10-05 NOTE — Progress Notes (Signed)
[place] Counselor/Therapist Progress Note  Patient ID: Angel Durham, MRN: [id number],    Date: 10/05/2022  Time Spent: 35 minutes 1101-1136-am  Treatment Type: Individual Therapy  Risk Assessment: Danger to Self:  No Self-injurious Behavior: No Danger to Others: No  Subjective: This session was held via video teletherapy. The patient consented to video teletherapy and was located in her home during this session. She is aware it is the responsibility of the patient to secure confidentiality on her end of the session. The provider was in a private home office for the duration of this session.    The patient arrived on time for her webex appointment  Issues addressed: 1-homework- completed a-staying on track, using HAALT-B, continued increased activity level -pt was more active -got a fit bit and [place] using to increase her activity level -did not eat as healthy   -would talk herself through a logical process -make month with daughter pleasurable without feelings of grief   -read Emerging Adult articles 4-daughter a-went skiing today with her boyfriend b-returns to school on Tuesday -pt looking forward to her going back to school -she thinks her daughter is okay going back -pt has had to be home at 10pm -she feels good about her daughter's decisions since she has been home  Treatment Plan Problems Addressed  Eating Disorders And Obesity, Type A Behavior  Goals 1. Achieve an overall decrease in pressured, driven behaviors.  Objective Understand the impact that historical thoughts and behaviors and other people's percptions impacted self-esteem. Target Date: 2023-07-10 Frequency: Biweekly  Progress: 0 Modality: individual  2. Alleviate sense of time urgency, free-floating anxiety, anger, and self-destructive behaviors. 3. Develop coping strategies (e.g., feeling identification, problem-solving, assertiveness) to address emotional issues that could lead to  relapse of the eating disorder. 4. Develop healthy cognitive patterns and beliefs about self that lead to positive identity and prevent a relapse of the eating disorder. 5. Develop healthy interpersonal relationships that lead to alleviation and help prevent the relapse of the eating disorder. 6. Develop social and recreational activities as a routine part of life. 7. Formulate and implement a new life attitudinal pattern that allows for a more relaxed pattern of living. Objective Develop a daily routine that reflects a balance between the quest for achievement and appreciation of aesthetic things. Target Date: 2023-07-10 Frequency: Biweekly  Progress: 0 Modality: individual  Related Interventions Assign the client to read "List of Aphorisms" in Treating Type A Behavior and Your Heart by Johann Capers three times daily for one or two weeks; then to pick several to incorporate into his/her life. Ask the client to list activities he/she could engage in for purely aesthetic enjoyment (e.g., visit an [employer], attend a symphony concert, hike in the woods, take painting lessons, etc.) and incorporate these into his/her life. Objective Describe the pattern of pressured, driven living. Target Date: 2023-07-10 Frequency: Biweekly  Progress: 0 Modality: individual  Related Interventions Assess examples of pressured lifestyle including associated situations, cognition, emotion, actions, and impact on client and others. Objective Verbalize a desire to reprioritize values toward less self-focus, more inner and other orientation. Target Date: 2023-07-10 Frequency: Biweekly  Progress: 0 Modality: individual  Related Interventions Explore and clarify the client's value system and assist in developing new priorities on the importance of relationships, recreation, spiritual growth, reflection time, giving to others (or assign "Developing Noncompetitive Values" in the Adult Psychotherapy Homework Planner  by Bryn Gulling). Objective Verbalize a commitment to learning new approaches managing self, time,  and relationships that emphasize the values of inner and other orientation. Target Date: 2023-07-10 Frequency: Biweekly  Progress: 0 Modality: individual  Related Interventions Ask the client to commit to attempting attitude and behavior changes to promote a healthier, less Type A lifestyle; explore with him/her what changes need to be made to become less Type A. Objective Decrease the frequency of taking work home. Target Date: 2023-07-10 Frequency: Biweekly  Progress: 0 Modality: individual  Objective Learn and implement respectful assertive communication knowledge and skills to replace insensitive directness or verbal aggression that is controlling. Target Date: 2023-07-10 Frequency: Biweekly  Progress: 0 Modality: individual  Related Interventions Train the client in assertive communication with emphasis on recognizing and refraining from aggressive communication (e.g., ignoring of the rights of others) to respectful, assertive communication. Objective Practice using new calming, cognitive, communication, and problem-solving skills in session with the therapist and during homework exercises. Target Date: 2023-07-10 Frequency: Biweekly  Progress: 0 Modality: individual  Related Interventions Assist the client in constructing a client-tailored strategy for managing pressure that combines any of the somatic, cognitive, communication, problem-solving, and/or conflict resolution skills relevant to his/her needs. Select situations in which the client will be increasingly challenged to apply his/her new strategies for managing stress. Use any of several techniques, including relaxation, imagery, behavioral rehearsal, modeling, role-playing, in vivo exposure, or behavioral experiments to help the client consolidate the use of his/her new stress management skills. Objective Demonstrate decreased  impatience with others by talking of appreciating and understanding the good qualities in others. Target Date: 2023-07-10 Frequency: Biweekly  Progress: 0 Modality: individual  Related Interventions Assign the client to talk to an associate or child, focusing on listening to the other person and learning several good things about that person; process the experience. Objective Decrease the number of hours worked daily and the frequency of taking work home. Target Date: 2023-07-10 Frequency: Daily  Progress: 0 Modality: individual  Related Interventions Review the client's pattern of hours spent working (at home and office) and recommend selected reductions; explore how these reductions could be accomplished (what specifically needs to change?). 8. Reach a balance between work/competitive and social/noncompetitive time in daily life. 9. Restore normal eating patterns, healthy weight maintenance, and a realistic appraisal of body size. Objective State a basis for positive identity that is not based on weight and appearance but on character, traits, relationships, and intrinsic value. Target Date: 2023-07-10 Frequency: Biweekly  Progress: 0 Modality: individual  Related Interventions Assist the client in identifying a basis for self-worth apart from body image by reviewing his/her talents, successes, positive traits, importance to others, and intrinsic spiritual value. Objective Verbalize an understanding of relapse prevention and the distinction between a lapse and a relapse. Target Date: 2023-07-10 Frequency: Biweekly  Progress: 0 Modality: individual  Related Interventions Discuss with the client the distinction between a lapse and relapse, associating a lapse with an initial and reversible return of distress, urges, or to avoid, and relapse with the decision to return to the cycle of maladaptive thoughts and actions (e.g., feeling anxious, binging, then purging). Identify with the client  future situations or circumstances in which lapses could occur. Objective Honestly describe the pattern of eating including types, amounts, and frequency of food consumed or hoarded. Target Date: 2023-07-10 Frequency: Biweekly  Progress: 0 Modality: individual  Related Interventions Assess the historical course of the disorder including the amount, type, and pattern of the client's food intake (e.g., too little food, too much food, binge eating, or hoarding food); perceived  personal and interpersonal triggers and personal goals. Objective Establish regular eating patterns by eating at regular intervals and consuming optimal daily calories. Target Date: 2023-07-10 Frequency: Biweekly  Progress: 0 Modality: individual  Related Interventions Establish healthy weight goals for the client per the Body Mass Index (BMI), the [place] Height and Weight Tables, or some other recognized standard. Establish an appropriate daily caloric intake for the client and assist him/her in meal planning. Objective Identify and develop a list of high-risk situations for unhealthy eating. Target Date: 2023-07-10 Frequency: Biweekly  Progress: 0 Modality: individual  Objective Learn and implement skills for managing urges to engage in unhealthy eating. Target Date: 2023-07-10 Frequency: Biweekly  Progress: 0 Modality: individual  Objective Participate in exercises to build skills in managing urges to emotionally eat. Target Date: 2023-07-10 Frequency: Biweekly  Progress: 0 Modality: individual  Objective Implement relapse prevention strategies for managing possible future anxiety symptoms. Target Date: 2023-07-10 Frequency: Biweekly  Progress: 0 Modality: individual  Related Interventions Instruct the client to routinely use strategies learned in therapy (e.g., continued exposure to previous external or internal cues that arise) to prevent relapse. Develop a "maintenance plan" with the client that  describes how the client plans to identify challenges, use knowledge and skills learned in therapy to manage them, and maintain positive changes gained in therapy. Objective Identify and develop a list of high-risk situations for unhealthy eating or weight loss practices. Target Date: 2023-07-10 Frequency: Daily  Progress: 0 Modality: individual  Related Interventions Assess the nature of any external cues (e.g., persons, objects, and situations) and internal cues (thoughts, images, and impulses) that precipitate the client's uncontrolled eating and/or compensatory weight management behaviors. Direct and assist the client in construction of a hierarchy of high-risk internal and external triggers for uncontrolled eating and/or compensatory weight management behaviors. Objective Learn and implement skills for managing urges to engage in unhealthy eating or weight loss practices. Target Date: 2023-07-10 Frequency: Daily  Progress: 0 Modality: individual  Related Interventions Teach the client tailored skills to manage high-risk situations including distraction, positive self-talk, problem-solving, conflict resolution (e.g., empathy, active listening, "I messages," respectful communication, assertiveness without aggression, compromise), or other social/ communication skills; use modeling, role-playing, and behavior rehearsal to work through several current situations. Objective Participate in exercises to build skills in managing urges to use maladaptive weight control practices. Target Date: 2023-07-10 Frequency: Daily  Progress: 0 Modality: individual  Related Interventions Assign homework exercises that allow the client to practice and strengthen skills learned in therapy; select initial high-risk situations that have a high likelihood of being a successful coping experience for the client; prepare and rehearse a plan for managing the risk situation; review/process the real life implementation by  the client, reinforcing success while providing corrective feedback toward improvement. 10. Terminate overeating and implement lifestyle changes that lead to weight loss and improved health. 11. Terminate the pattern of binge eating and purging behavior with a return to eating normal amounts of nutritious foods. 12. Terminate the pattern of binge eating with a return to eating normal amounts of nutritious foods.  Diagnosis:No diagnosis found.  Plan:   -next session is Wednesday, October 31, 2021 at 12pm.

## 2022-10-29 ENCOUNTER — Encounter: Payer: Self-pay | Admitting: Cardiovascular Disease

## 2022-10-30 ENCOUNTER — Telehealth: Payer: Self-pay | Admitting: *Deleted

## 2022-10-30 NOTE — Telephone Encounter (Signed)
Records and CPAP orders faxed to [facility] per patient request.

## 2022-10-31 ENCOUNTER — Ambulatory Visit: Payer: 59 | Admitting: Professional

## 2022-11-08 ENCOUNTER — Encounter (HOSPITAL_COMMUNITY): Payer: Self-pay | Admitting: *Deleted

## 2022-11-28 ENCOUNTER — Encounter: Payer: Self-pay | Admitting: Professional

## 2022-11-28 ENCOUNTER — Ambulatory Visit (INDEPENDENT_AMBULATORY_CARE_PROVIDER_SITE_OTHER): Payer: 59 | Admitting: Professional

## 2022-11-28 ENCOUNTER — Ambulatory Visit: Payer: 59 | Admitting: Professional

## 2022-11-28 DIAGNOSIS — R632 Polyphagia: Secondary | ICD-10-CM

## 2022-11-28 DIAGNOSIS — F4322 Adjustment disorder with anxiety: Secondary | ICD-10-CM

## 2022-11-28 NOTE — Progress Notes (Signed)
[facility] Counselor/Therapist Progress Note  Patient ID: Angel Durham, MRN: [id number],    Date: 11/28/2022  Time Spent: 29 minutes 1202-1231pm  Treatment Type: Individual Therapy  Risk Assessment: Danger to Self:  No Self-injurious Behavior: No Danger to Others: No  Subjective: This session was held via video teletherapy. The patient consented to video teletherapy and was located in her home during this session. She is aware it is the responsibility of the patient to secure confidentiality on her end of the session. The provider was in a private home office for the duration of this session.    The patient arrived on time for her webex appointment.  Issues addressed: 1-weight -has maintained her weight -her clothing is fitting loosely -being more aware of the reason she is eating -using positive coping   -will make a cup of lightly sweetened tea   -Haney and sons tea at [facility] 2-challenges -could not identify any -still being conscious of portion sizes -able to indulge in a sweet treat but not to over-extend 3-work -stress level has decreased -has talked to manager about taking on more later this year -she has discussed moving to a new position -wants to expand, life-long learner 4-daughter a-finishing freshman year b-has interview for internship next week -pd internship through [employer] in [place] 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 Achieve an overall decrease in pressured, driven behaviors.  Objective Understand the impact that historical thoughts and behaviors and other people's percptions impacted self-esteem. Target Date: 2023-07-10 Frequency: Biweekly  Progress: 0 Modality: individual  2. Alleviate sense of time urgency, free-floating anxiety, anger, and  self-destructive behaviors. 3. Develop coping strategies (e.g., feeling identification, problem-solving, assertiveness) to address emotional issues that could lead to relapse of the eating disorder. 4. Develop healthy cognitive patterns and beliefs about self that lead to positive identity and prevent a relapse of the eating disorder. 5. Develop healthy interpersonal relationships that lead to alleviation and help prevent the relapse of the eating disorder. 6. Develop social and recreational activities as a routine part of life. 7. Formulate and implement a new life attitudinal pattern that allows for a more relaxed pattern of living. Objective Develop a daily routine that reflects a balance between the quest for achievement and appreciation of aesthetic things. Target Date: 2023-07-10 Frequency: Biweekly  Progress: 0 Modality: individual  Related Interventions Assign the client to read "List of Aphorisms" in Treating Type A Behavior and Your Heart by Johann Capers three times daily for one or two weeks; then to pick several to incorporate into his/her life. Ask the client to list activities he/she could engage in for purely aesthetic enjoyment (e.g., visit an [employer], attend a symphony concert, hike in the woods, take painting lessons, etc.) and incorporate these into his/her life. Objective Describe the pattern of pressured, driven living. Target Date: 2023-07-10 Frequency: Biweekly  Progress: 0 Modality: individual  Related Interventions Assess examples of pressured lifestyle including associated situations, cognition, emotion, actions, and impact on client and others. Objective Verbalize a desire to reprioritize values toward less self-focus, more inner and other orientation. Target Date: 2023-07-10 Frequency: Biweekly  Progress: 0 Modality: individual  Related Interventions Explore and clarify the client's value system and assist in developing new priorities on the importance of  relationships, recreation, spiritual growth, reflection time, giving to others (  or assign "Developing Noncompetitive Values" in the Adult Psychotherapy Homework Planner by Bryn Gulling). Objective Verbalize a commitment to learning new approaches managing self, time, and relationships that emphasize the values of inner and other orientation. Target Date: 2023-07-10 Frequency: Biweekly  Progress: 0 Modality: individual  Related Interventions Ask the client to commit to attempting attitude and behavior changes to promote a healthier, less Type A lifestyle; explore with him/her what changes need to be made to become less Type A. Objective Decrease the frequency of taking work home. Target Date: 2023-07-10 Frequency: Biweekly  Progress: 0 Modality: individual  Objective Learn and implement respectful assertive communication knowledge and skills to replace insensitive directness or verbal aggression that is controlling. Target Date: 2023-07-10 Frequency: Biweekly  Progress: 0 Modality: individual  Related Interventions Train the client in assertive communication with emphasis on recognizing and refraining from aggressive communication (e.g., ignoring of the rights of others) to respectful, assertive communication. Objective Practice using new calming, cognitive, communication, and problem-solving skills in session with the therapist and during homework exercises. Target Date: 2023-07-10 Frequency: Biweekly  Progress: 0 Modality: individual  Related Interventions Assist the client in constructing a client-tailored strategy for managing pressure that combines any of the somatic, cognitive, communication, problem-solving, and/or conflict resolution skills relevant to his/her needs. Select situations in which the client will be increasingly challenged to apply his/her new strategies for managing stress. Use any of several techniques, including relaxation, imagery, behavioral rehearsal, modeling,  role-playing, in vivo exposure, or behavioral experiments to help the client consolidate the use of his/her new stress management skills. Objective Demonstrate decreased impatience with others by talking of appreciating and understanding the good qualities in others. Target Date: 2023-07-10 Frequency: Biweekly  Progress: 0 Modality: individual  Related Interventions Assign the client to talk to an associate or child, focusing on listening to the other person and learning several good things about that person; process the experience. Objective Decrease the number of hours worked daily and the frequency of taking work home. Target Date: 2023-07-10 Frequency: Daily  Progress: 0 Modality: individual  Related Interventions Review the client's pattern of hours spent working (at home and office) and recommend selected reductions; explore how these reductions could be accomplished (what specifically needs to change?). 8. Reach a balance between work/competitive and social/noncompetitive time in daily life. 9. Restore normal eating patterns, healthy weight maintenance, and a realistic appraisal of body size. Objective State a basis for positive identity that is not based on weight and appearance but on character, traits, relationships, and intrinsic value. Target Date: 2023-07-10 Frequency: Biweekly  Progress: 0 Modality: individual  Related Interventions Assist the client in identifying a basis for self-worth apart from body image by reviewing his/her talents, successes, positive traits, importance to others, and intrinsic spiritual value. Objective Verbalize an understanding of relapse prevention and the distinction between a lapse and a relapse. Target Date: 2023-07-10 Frequency: Biweekly  Progress: 0 Modality: individual  Related Interventions Discuss with the client the distinction between a lapse and relapse, associating a lapse with an initial and reversible return of distress, urges, or to  avoid, and relapse with the decision to return to the cycle of maladaptive thoughts and actions (e.g., feeling anxious, binging, then purging). Identify with the client future situations or circumstances in which lapses could occur. Objective Honestly describe the pattern of eating including types, amounts, and frequency of food consumed or hoarded. Target Date: 2023-07-10 Frequency: Biweekly  Progress: 0 Modality: individual  Related Interventions Assess the historical course of the disorder  including the amount, type, and pattern of the client's food intake (e.g., too little food, too much food, binge eating, or hoarding food); perceived personal and interpersonal triggers and personal goals. Objective Establish regular eating patterns by eating at regular intervals and consuming optimal daily calories. Target Date: 2023-07-10 Frequency: Biweekly  Progress: 0 Modality: individual  Related Interventions Establish healthy weight goals for the client per the Body Mass Index (BMI), the [place] Height and Weight Tables, or some other recognized standard. Establish an appropriate daily caloric intake for the client and assist him/her in meal planning. Objective Identify and develop a list of high-risk situations for unhealthy eating. Target Date: 2023-07-10 Frequency: Biweekly  Progress: 0 Modality: individual  Objective Learn and implement skills for managing urges to engage in unhealthy eating. Target Date: 2023-07-10 Frequency: Biweekly  Progress: 0 Modality: individual  Objective Participate in exercises to build skills in managing urges to emotionally eat. Target Date: 2023-07-10 Frequency: Biweekly  Progress: 0 Modality: individual  Objective Implement relapse prevention strategies for managing possible future anxiety symptoms. Target Date: 2023-07-10 Frequency: Biweekly  Progress: 0 Modality: individual  Related Interventions Instruct the client to routinely use strategies  learned in therapy (e.g., continued exposure to previous external or internal cues that arise) to prevent relapse. Develop a "maintenance plan" with the client that describes how the client plans to identify challenges, use knowledge and skills learned in therapy to manage them, and maintain positive changes gained in therapy. Objective Identify and develop a list of high-risk situations for unhealthy eating or weight loss practices. Target Date: 2023-07-10 Frequency: Daily  Progress: 0 Modality: individual  Related Interventions Assess the nature of any external cues (e.g., persons, objects, and situations) and internal cues (thoughts, images, and impulses) that precipitate the client's uncontrolled eating and/or compensatory weight management behaviors. Direct and assist the client in construction of a hierarchy of high-risk internal and external triggers for uncontrolled eating and/or compensatory weight management behaviors. Objective Learn and implement skills for managing urges to engage in unhealthy eating or weight loss practices. Target Date: 2023-07-10 Frequency: Daily  Progress: 0 Modality: individual  Related Interventions Teach the client tailored skills to manage high-risk situations including distraction, positive self-talk, problem-solving, conflict resolution (e.g., empathy, active listening, "I messages," respectful communication, assertiveness without aggression, compromise), or other social/ communication skills; use modeling, role-playing, and behavior rehearsal to work through several current situations. Objective Participate in exercises to build skills in managing urges to use maladaptive weight control practices. Target Date: 2023-07-10 Frequency: Daily  Progress: 0 Modality: individual  Related Interventions Assign homework exercises that allow the client to practice and strengthen skills learned in therapy; select initial high-risk situations that have a high likelihood  of being a successful coping experience for the client; prepare and rehearse a plan for managing the risk situation; review/process the real life implementation by the client, reinforcing success while providing corrective feedback toward improvement. 10. Terminate overeating and implement lifestyle changes that lead to weight loss and improved health. 11. Terminate the pattern of binge eating and purging behavior with a return to eating normal amounts of nutritious foods. 12. Terminate the pattern of binge eating with a return to eating normal amounts of nutritious foods.  Diagnosis:Adjustment disorder with anxiety  Binge eating  Plan:  -will call as needed.

## 2022-12-02 ENCOUNTER — Other Ambulatory Visit: Payer: Self-pay | Admitting: Sports Medicine

## 2022-12-02 DIAGNOSIS — M1612 Unilateral primary osteoarthritis, left hip: Secondary | ICD-10-CM

## 2022-12-24 ENCOUNTER — Encounter: Payer: Self-pay | Admitting: Sports Medicine

## 2022-12-24 DIAGNOSIS — M1612 Unilateral primary osteoarthritis, left hip: Secondary | ICD-10-CM

## 2022-12-24 MED ORDER — CELECOXIB 200 MG PO CAPS: ORAL_CAPSULE | ORAL | 2 refills | Status: DC

## 2022-12-26 ENCOUNTER — Ambulatory Visit: Payer: 59 | Admitting: Professional

## 2023-01-01 ENCOUNTER — Telehealth: Payer: Self-pay | Admitting: Cardiovascular Disease

## 2023-01-01 NOTE — Telephone Encounter (Signed)
Called patient, advised that she was doing well with no episodes, but last week she had two different episodes. She states that she becomes tired and give out after the episodes. No symptoms to report at this time. She did not take Cardizem because her HR did not go over 100- she would like to know if she can take the medication if the HR does not go over 100, but when she has the episodes to see if they improve. She is scheduled for a visit on 04/30 with NP, will send to MD/NP seeing patient to see if they have any other recommendations.  Thanks!

## 2023-01-01 NOTE — Telephone Encounter (Signed)
LVM to call back.

## 2023-01-01 NOTE — Telephone Encounter (Signed)
Patient returned call to answer further questions:   1- heart rate- during the episodes were normal running 80's and 90's, did get to 103 at one time but went right back down and did not stay elevated. 2- describing the episode- patient was asleep and it woke her up from her sleep, feeling her heart racing. 3- symptoms- lightheaded as stated below, and a flushed feeling  4- duration- the episode did not last long, symptoms per patient lasted about 2-3 days (lightheaded and flushed feeling)  5- resolve- yes per patient as stated below no medication was taken, it improved on its on.   Extra information given- the episode was last Tuesday, and she has felt normal and herself since Sunday- HR has been in the 60's and 70's.   Hope that helps.  Thanks!

## 2023-01-01 NOTE — Telephone Encounter (Signed)
Called patient, advised of message below. Patient verbalized understanding.  

## 2023-01-01 NOTE — Telephone Encounter (Signed)
Patient c/o Palpitations:  High priority if patient c/o lightheadedness, shortness of breath, or chest pain  How long have you had palpitations/irregular HR/ Afib? Are you having the symptoms now? Hasn't had an episode in 15 months, but in the last week she has had 2 episodes. No sx right now  Are you currently experiencing lightheadedness, SOB or CP? Had lightheadedness during both episodes that she's had during the week, but no sx right now.   Do you have a history of afib (atrial fibrillation) or irregular heart rhythm? Yes  Have you checked your BP or HR? (document readings if available): BP during both episdoes fine and her HR hasn't gone over 100. Since HR hasn't gone over 100, she hasn't taken medication.   Are you experiencing any other symptoms? No  An appt was scheduled on 4/30 with [place] NP for her episodes.

## 2023-01-17 NOTE — Progress Notes (Signed)
Cardiology Clinic Note   Date: 01/18/2023 ID: [name], DOB 1971/01/27, MRN [id number]  Primary Cardiologist:  Rudi Coco, NP (Inactive)  Patient Profile    Angel Durham is a 52 y.o. female who presents to the clinic today for evaluation of increased palpitations.  Past medical history significant for: PAF. Onset 09/21/2021. S/p DCCV 09/21/2021. Echo 10/19/2021: EF 55 to 60%.  Grade I DD.  No valvular abnormalities. OSA. CPAP 100% adherence. GERD. Migraines. Prediabetes.   History of Present Illness    Angel Durham was first evaluated by Rudi Coco, NP on 09/26/2021 for palpitations and weakness following an ED visit several days prior.  Patient presented to the emergency department on 09/21/2021 with complaints of palpitations and lightheadedness that awoke her from sleep.  She was found to be in A-fib.  Patient was started on Cardizem and on-call cardiology fellow was consulted who agreed cardioversion was appropriate.  Patient underwent successful DCCV.  She was discharged to follow-up with cardiology as an outpatient.  Patient was started on Eliquis x 4 weeks then instructed to stop.  She was given a prescription for diltiazem 30 mg every 4 hours as needed for breakthrough A-fib, continued on nadolol, and referred for sleep study.  Echo showed normal LV function (detailed above).  She underwent sleep study to with Dr. Tresa Endo in February 2023 and was started on CPAP.  She was doing well at follow-up with Dr. Tresa Endo in May 2023.  Today, patient reports she donated blood on 12/25/2022.  That evening she felt lightheaded and had to sit down several times while making dinner.  (She had a similar episode of lightheadedness after donating blood in December 2023).  Later that evening she woke from sleep feeling like her heart was racing with associated lightheadedness and a flushed feeling.  She put on her Fitbit and tracked her heart rate which fluctuated between 80 and 104  bpm but was not sustained >100 BPM.  The fluttering and flushed feeling lasted for several days.  Since that time she has had fewer and fewer episodes.  This because in the past week for with just 1 episode of feeling fluttering, flushed and pounding heartbeat.  She purchased a Kardia mobile device and it has not indicated A-fib at any point that she has been checking it.  Fitbit has also not alerted for arrhythmia.  Heart rate at rest is 50 to 60 bpm and will randomly jump to 80 to 90 bpm while she is just sitting doing nothing.  She reports her heart feels fast but not irregular.  She otherwise is feeling well.  She has lost 30 pounds since October 2023.  She does not follow a dedicated exercise program.  She denies chest pain, shortness of breath, DOE, lower extremity edema, orthopnea (uses CPAP 100% of the time) or PND.    ROS: All other systems reviewed and are otherwise negative except as noted in History of Present Illness.  Studies Reviewed    ECG personally reviewed by me today: Sinus bradycardia, 57 bpm.  No significant changes from 12/18/2021.  Risk Assessment/Calculations     CHA2DS2-VASc Score = 1   This indicates a 0.6% annual risk of stroke. The patient's score is based upon: CHF History: 0 HTN History: 0 Diabetes History: 0 Stroke History: 0 Vascular Disease History: 0 Age Score: 0 Gender Score: 1              Physical Exam    VS:  BP 124/74  Pulse (!) 57   Ht  (1.676 m)   Wt 242 lb 12.8 oz (110.1 kg)   SpO2 99%   BMI 39.19 kg/m  , BMI Body mass index is 39.19 kg/m.  GEN: Well nourished, well developed, in no acute distress. Neck: No JVD or carotid bruits. Cardiac:  RRR. No murmurs. No rubs or gallops.   Respiratory:  Respirations regular and unlabored. Clear to auscultation without rales, wheezing or rhonchi. GI: Soft, nontender, nondistended. Extremities: Radials/DP/PT 2+ and equal bilaterally. No clubbing or cyanosis. No edema.  Skin: Warm and dry,  no rash. Neuro: Strength intact.  Assessment & Plan   PAF/palpitations.  Onset December 2022.  S/p DCCV 09/21/2021 with anticoagulation x 4 weeks then DC'd.  Patient denies any repeat episodes of PAF or palpitations from December 2022 until 12/25/2022 after donating blood.  Palpitations woke her from sleep and she felt lightheaded and flushed.  Fitbit showed heart rate fluctuating from 80 to 104 bpm.  Heart rate was never sustained >100 so she did not take diltiazem.  She went several days in a row with episodes of palpitations and they seem to slowly be dissipating.  This week has been in the past week for with only 1 episode of fluttering, flushed, pounding.  Heart rate will jump from 50 to 60s to 80s to 90s while at rest.  No associated shortness of breath, DOE, chest pain.  She is 100% adherent with CPAP.  Will check CBC, BMP, TSH, Mg.  2-week ZIO for further evaluation.  Disposition: CBC, BMP, TSH, Mg.  2-week ZIO.  Return for previously scheduled visit with Dr. Tresa Endo or sooner as needed.        Signed, Etta Grandchild. Esmond Hinch, DNP, NP-C

## 2023-01-18 ENCOUNTER — Encounter: Payer: Self-pay | Admitting: Student

## 2023-01-18 ENCOUNTER — Ambulatory Visit: Payer: 59 | Attending: Student | Admitting: Student

## 2023-01-18 ENCOUNTER — Ambulatory Visit (INDEPENDENT_AMBULATORY_CARE_PROVIDER_SITE_OTHER): Payer: 59

## 2023-01-18 VITALS — BP 124/74 | HR 57 | Ht 66.0 in | Wt 242.8 lb

## 2023-01-18 DIAGNOSIS — R002 Palpitations: Secondary | ICD-10-CM

## 2023-01-18 DIAGNOSIS — I480 Paroxysmal atrial fibrillation: Secondary | ICD-10-CM | POA: Diagnosis not present

## 2023-01-18 NOTE — Progress Notes (Unsigned)
Enrolled patient for a 14 day Zio XT  monitor to be mailed to patients home  °

## 2023-01-18 NOTE — Patient Instructions (Signed)
Medication Instructions:  Your physician recommends that you continue on your current medications as directed. Please refer to the Current Medication list given to you today.  *If you need a refill on your cardiac medications before your next appointment, please call your pharmacy*   Lab Work: Your physician recommends that you have the following labs drawn today: CBC, BMET, TSH and MAG  If you have labs (blood work) drawn today and your tests are completely normal, you will receive your results only by: MyChart Message (if you have MyChart) OR A paper copy in the mail If you have any lab test that is abnormal or we need to change your treatment, we will call you to review the results.   Testing/Procedures: Christena Deem- Long Term Monitor Instructions  Your physician has requested you wear a ZIO patch monitor for 14 days.  This is a single patch monitor. [employer] supplies one patch monitor per enrollment. Additional stickers are not available. Please do not apply patch if you will be having a Nuclear Stress Test,  Echocardiogram, Cardiac CT, MRI, or Chest Xray during the period you would be wearing the  monitor. The patch cannot be worn during these tests. You cannot remove and re-apply the  ZIO XT patch monitor.  Your ZIO patch monitor will be mailed 3 day USPS to your address on file. It may take 3-5 days  to receive your monitor after you have been enrolled.  Once you have received your monitor, please review the enclosed instructions. Your monitor  has already been registered assigning a specific monitor serial # to you.  Billing and Patient Assistance Program Information  We have supplied [employer] with any of your insurance information on file for billing purposes. [employer] offers a sliding scale Patient Assistance Program for patients that do not have  insurance, or whose insurance does not completely cover the cost of the ZIO monitor.  You must apply for the Patient Assistance Program  to qualify for this discounted rate.  To apply, please call [employer] at [phone], select option 4, select option 2, ask to apply for  Patient Assistance Program. Meredeth Ide will ask your household income, and how many people  are in your household. They will quote your out-of-pocket cost based on that information.  [employer] will also be able to set up a 41-month, interest-free payment plan if needed.  Applying the monitor   Shave hair from upper left chest.  Hold abrader disc by orange tab. Rub abrader in 40 strokes over the upper left chest as  indicated in your monitor instructions.  Clean area with 4 enclosed alcohol pads. Let dry.  Apply patch as indicated in monitor instructions. Patch will be placed under collarbone on left  side of chest with arrow pointing upward.  Rub patch adhesive wings for 2 minutes. Remove white label marked "1". Remove the white  label marked "2". Rub patch adhesive wings for 2 additional minutes.  While looking in a mirror, press and release button in center of patch. A small green light will  flash 3-4 times. This will be your only indicator that the monitor has been turned on.  Do not shower for the first 24 hours. You may shower after the first 24 hours.  Press the button if you feel a symptom. You will hear a small click. Record Date, Time and  Symptom in the Patient Logbook.  When you are ready to remove the patch, follow instructions on the last 2 pages of Patient  Logbook. Stick patch monitor onto the last page of Patient Logbook.  Place Patient Logbook in the blue and white box. Use locking tab on box and tape box closed  securely. The blue and white box has prepaid postage on it. Please place it in the mailbox as  soon as possible. Your physician should have your test results approximately 7 days after the  monitor has been mailed back to [facility].  Call [facility] Customer Care at [phone] if you have questions regarding  your ZIO XT  patch monitor. Call them immediately if you see an orange light blinking on your  monitor.  If your monitor falls off in less than 4 days, contact our Monitor department at [phone].  If your monitor becomes loose or falls off after 4 days call [employer] at [phone] for  suggestions on securing your monitor    Follow-Up: At [facility], you and your health needs are our priority.  As part of our continuing mission to provide you with exceptional heart care, we have created designated Provider Care Teams.  These Care Teams include your primary Cardiologist (physician) and Advanced Practice Providers (APPs -  Physician Assistants and Nurse Practitioners) who all work together to provide you with the care you need, when you need it.  We recommend signing up for the patient portal called "MyChart".  Sign up information is provided on this After Visit Summary.  MyChart is used to connect with patients for Virtual Visits (Telemedicine).  Patients are able to view lab/test results, encounter notes, upcoming appointments, etc.  Non-urgent messages can be sent to your provider as well.   To learn more about what you can do with MyChart, go to [email or web address].    Your next appointment:   Please keep upcoming appointment with Dr. Tresa Endo pending results we will call you if we need you to come in earlier.

## 2023-01-19 LAB — BASIC METABOLIC PANEL
BUN/Creatinine Ratio: 24 — ABNORMAL HIGH (ref 9–23)
BUN: 14 mg/dL (ref 6–24)
CO2: 24 mmol/L (ref 20–29)
Calcium: 9.8 mg/dL (ref 8.7–10.2)
Chloride: 101 mmol/L (ref 96–106)
Creatinine, Ser: 0.59 mg/dL (ref 0.57–1.00)
Glucose: 82 mg/dL (ref 70–99)
Potassium: 5.5 mmol/L — ABNORMAL HIGH (ref 3.5–5.2)
Sodium: 139 mmol/L (ref 134–144)
eGFR: 108 mL/min/{1.73_m2} (ref >59)

## 2023-01-19 LAB — CBC
Hematocrit: 34.9 % (ref 34.0–46.6)
Hemoglobin: 10.9 g/dL — ABNORMAL LOW (ref 11.1–15.9)
MCH: 25.2 pg — ABNORMAL LOW (ref 26.6–33.0)
MCHC: 31.2 g/dL — ABNORMAL LOW (ref 31.5–35.7)
MCV: 81 fL (ref 79–97)
Platelets: 467 10*3/uL — ABNORMAL HIGH (ref 150–450)
RBC: 4.33 x10E6/uL (ref 3.77–5.28)
RDW: 15.3 % (ref 11.7–15.4)
WBC: 6.1 10*3/uL (ref 3.4–10.8)

## 2023-01-19 LAB — TSH: TSH: 2.09 u[IU]/mL (ref 0.450–4.500)

## 2023-01-19 LAB — MAGNESIUM: Magnesium: 2.1 mg/dL (ref 1.6–2.3)

## 2023-01-22 ENCOUNTER — Ambulatory Visit: Payer: 59 | Admitting: Professional

## 2023-01-22 ENCOUNTER — Other Ambulatory Visit: Payer: Self-pay

## 2023-01-22 DIAGNOSIS — R002 Palpitations: Secondary | ICD-10-CM | POA: Diagnosis not present

## 2023-01-23 ENCOUNTER — Other Ambulatory Visit: Payer: Self-pay

## 2023-01-23 DIAGNOSIS — R002 Palpitations: Secondary | ICD-10-CM

## 2023-01-23 NOTE — Addendum Note (Signed)
Addended by: Missy Sabins on: 01/23/2023 10:20 AM   Modules accepted: Orders

## 2023-01-24 LAB — BASIC METABOLIC PANEL
BUN/Creatinine Ratio: 22 (ref 9–23)
BUN: 13 mg/dL (ref 6–24)
CO2: 24 mmol/L (ref 20–29)
Calcium: 9.6 mg/dL (ref 8.7–10.2)
Chloride: 104 mmol/L (ref 96–106)
Creatinine, Ser: 0.6 mg/dL (ref 0.57–1.00)
Glucose: 88 mg/dL (ref 70–99)
Potassium: 4.9 mmol/L (ref 3.5–5.2)
Sodium: 142 mmol/L (ref 134–144)
eGFR: 107 mL/min/{1.73_m2} (ref >59)

## 2023-01-29 ENCOUNTER — Ambulatory Visit: Payer: 59 | Admitting: Student

## 2023-02-19 ENCOUNTER — Ambulatory Visit: Payer: 59 | Attending: Cardiovascular Disease | Admitting: Cardiovascular Disease

## 2023-02-19 ENCOUNTER — Encounter: Payer: Self-pay | Admitting: Cardiovascular Disease

## 2023-02-19 VITALS — BP 118/76 | HR 62 | Ht 66.0 in | Wt 244.0 lb

## 2023-02-19 DIAGNOSIS — I471 Supraventricular tachycardia, unspecified: Secondary | ICD-10-CM

## 2023-02-19 DIAGNOSIS — G4733 Obstructive sleep apnea (adult) (pediatric): Secondary | ICD-10-CM

## 2023-02-19 DIAGNOSIS — E669 Obesity, unspecified: Secondary | ICD-10-CM

## 2023-02-19 DIAGNOSIS — I480 Paroxysmal atrial fibrillation: Secondary | ICD-10-CM | POA: Diagnosis not present

## 2023-02-19 DIAGNOSIS — G4736 Sleep related hypoventilation in conditions classified elsewhere: Secondary | ICD-10-CM

## 2023-02-19 DIAGNOSIS — R002 Palpitations: Secondary | ICD-10-CM | POA: Diagnosis not present

## 2023-02-19 NOTE — Progress Notes (Signed)
Cardiology Office Note    Date:  02/26/2023   ID:  Angel Durham, DOB Feb 07, 1971, MRN [id number]  PCP:  Charlton Amor, DO  Cardiologist:  Nicki Guadalajara, MD (sleep); Rudi Coco, [facility]  1 year follow-up sleep evaluation   History of Present Illness:  Angel Durham is a 52 y.o. female who has a history of GERD, migraine headaches, and recently had developed palpitations and weakness.  She presented to the emergency room on September 21, 2021 and was found to be in atrial fibrillation.  She was started on a Cardizem drip with some improvement in her rapid heart rate.  Laboratory studies were unremarkable.  She underwent successful DC cardioversion with restoration of sinus rhythm.  She subsequently presented to atrial fibrillation clinic and was evaluated by Rudi Coco, NP on September 26, 2021.  ECG showed sinus rhythm.  She denied any recurrent episodes of palpitations.  There was no history of tobacco or alcohol use or excessive caffeine use.  Upon questioning it was noted that she doors and when her symptoms had initially occurred she was awakened from sleep with atrial fibrillation.  She was started on Eliquis for 4 weeks.  CHA2DS2-VASc score was 1.  She underwent a 2D echo Doppler study on October 19, 2021 which showed normal LV function with EF 55 to 60% and grade 1 diastolic relaxation action.    I saw her for my initial sleep evaluation on Jan 31, 2022.  With her awakening from sleep with atrial fibrillation and history of snoring she was referred for a sleep study  on November 10, 2021 and was a home study.  This revealed moderate overall sleep apnea with an AHI of 15.4.  She had significant oxygen desaturation to a nadir of 76%.  The severity during REM sleep could not be assessed on this study.  She snored for 47.1 minutes of her sleep duration representing 13.1%.  She subsequently initiated AutoPap therapy and had a CPAP set up date on December 26, 2021 with a ResMed air sense 11  AutoSet CPAP unit.  Her initial pressure to a range of 7 to 16 cm of water.  She has been using a ResMed AirFit N30i mask.  A download was obtained from March 28 through January 24, 2022.  Compliance is excellent at 100%.  Average sleep duration with CPAP was 6 hours and 42 minutes.  Her 95th percentile pressure was 11.1 and maximum average pressure 12.3 cm of water.  AHI was excellent at 0.7/h.  Since initiating CPAP therapy she has felt improved.  She was unaware of any residual daytime snoring.  She denies any recurrent palpitations or atrial fibrillation.  Typically she goes to bed at 10 PM and wakes up at 5:30 AM.  She is on a baby aspirin, nadolol 20 mg daily, and has a prescription for diltiazem 30 mg to take as needed with heart rate greater than 100.  During that evaluation, I had an extensive discussion with her regarding sleep apnea and its effects on sleep architecture as well as potential adverse cardiovascular consequences.  In particular I discussed its effects related to blood pressure elevation, potential for nocturnal palpitations, increased risk for atrial fibrillation as well as increased risk for inflammation, insulin resistance, GERD, and the potential for nocturnal ischemia in the setting of apnea precipitated nocturnal hypoxia.  Since I last saw her, she has been evaluated by Carlyon Shadow, NP.  She had experienced an episode of lightheadedness after donating blood on  December 25, 2022.  Later that evening she felt her heart racing and a sensation of a flushed feeling.  She purchased a Kardia mobile device which had not indicated A-fib.  She ultimately was referred for a Zio patch monitor which she wore from April 23 through Feb 05, 2023.  The predominant rhythm was sinus rhythm with an average rate at 67 bpm.  Her slowest heart rate was sinus bradycardia while sleeping with heart rate at 46 and fastest sinus rhythm was 111 bpm.  She had 4 brief episodes of SVT with the run with the fastest  interval lasting 9 beats with a maximum rate at 146 (average 136.  She had rare PACs and atrial couplets and very rare isolated PVCs.  Presently, she feels well and denies any further awareness of recent palpitations.  She now sees Stephens Shire, DO for primary care in [place].   Past Medical History:  Diagnosis Date   Atrial fibrillation (HCC)    Gastroesophageal reflux    Migraine     Past Surgical History:  Procedure Laterality Date   CHOLECYSTECTOMY  2007   SPERMATIC VEIN LIGATION     TONSILLECTOMY      Current Medications: Outpatient Medications Prior to Visit  Medication Sig Dispense Refill   aspirin 81 MG EC tablet Take 81 mg by mouth daily.     celecoxib (CELEBREX) 200 MG capsule ONE TO 2 CAPS BY MOUTH DAILY AS NEEDED FOR PAIN. 60 capsule 2   nadolol (CORGARD) 20 MG tablet Take 20 mg by mouth daily.     omeprazole (PRILOSEC) 40 MG capsule Take 40 mg by mouth daily.     ondansetron (ZOFRAN-ODT) 4 MG disintegrating tablet Take by mouth.     diltiazem (CARDIZEM) 30 MG tablet Take 1 tablet every 4 hours AS NEEDED for heart rate >100 (Patient not taking: Reported on 01/18/2023) 30 tablet 1   Semaglutide,0.25 or 0.5MG /DOS, (OZEMPIC, 0.25 OR 0.5 MG/DOSE,) 2 MG/3ML SOPN Inject 0.25 mg into the skin once a week. (Patient not taking: Reported on 01/18/2023) 3 mL 2   Semaglutide,0.25 or 0.5MG /DOS, (OZEMPIC, 0.25 OR 0.5 MG/DOSE,) 2 MG/3ML SOPN Inject 0.25 mg into the skin once a week. (Patient not taking: Reported on 01/18/2023) 3 mL 0   WEGOVY 0.25 MG/0.5ML SOAJ Inject 0.25 mg into the skin once a week. Use this dose for 1 month (4 shots) and then increase to next higher dose. (Patient not taking: Reported on 01/18/2023) 2 mL 0   zolmitriptan (ZOMIG) 5 MG nasal solution Place into the nose as needed for migraine. (Patient not taking: Reported on 02/19/2023)     No facility-administered medications prior to visit.     Allergies:   Penicillins   Social History   Socioeconomic History    Marital status: Married    Spouse name: Not on file   Number of children: Not on file   Years of education: Not on file   Highest education level: Not on file  Occupational History   Not on file  Tobacco Use   Smoking status: Former   Smokeless tobacco: Never   Tobacco comments:    1 pack a day for 5 years  Substance and Sexual Activity   Alcohol use: Yes    Comment: rarely   Drug use: Not Currently    Types: Marijuana    Comment: former   Sexual activity: Not Currently    Partners: Male  Other Topics Concern   Not on file  Social History  Narrative   Not on file   Social Determinants of Health   Financial Resource Strain: Not on file  Food Insecurity: Not on file  Transportation Needs: Not on file  Physical Activity: Not on file  Stress: Not on file  Social Connections: Not on file     Socially she was born in [place], [place].  She is married and has an 66 year old child.  Family History: Family history is notable that her father is 88 and mother 58.  Her maternal grand father had a myocardial infarction in his 57s.  Grandmother had COPD.  ROS General: Negative; No fevers, chills, or night sweats;  HEENT: Negative; No changes in vision or hearing, sinus congestion, difficulty swallowing Pulmonary: Negative; No cough, wheezing, shortness of breath, hemoptysis Cardiovascular: PAF x1, no chest pain or shortness of breath. GI: Negative; No nausea, vomiting, diarrhea, or abdominal pain GU: Negative; No dysuria, hematuria, or difficulty voiding Musculoskeletal: Negative; no myalgias, joint pain, or weakness Hematologic/Oncology: Negative; no easy bruising, bleeding Endocrine: Negative; no heat/cold intolerance; no diabetes Neuro: Negative; no changes in balance, headaches Skin: Negative; No rashes or skin lesions Psychiatric: Negative; No behavioral problems, depression Sleep: Only diagnosed OSA; history of snoring.  Since initiating CPAP, sleeping well.  Epworth  Sleepiness Scale score was calculated the office today and this endorsed at 6 arguing against residual daytime sleepiness.No bruxism, restless legs, hypnogognic hallucinations, no cataplexy Other comprehensive 14 point system review is negative.   PHYSICAL EXAM:   VS:  BP 118/76   Pulse 62   Ht 5\' 6"  (1.676 m)   Wt 244 lb (110.7 kg)   SpO2 98%   BMI 39.38 kg/m     Repeat blood pressure by me was 114/70  Wt Readings from Last 3 Encounters:  02/19/23 244 lb (110.7 kg)  01/18/23 242 lb 12.8 oz (110.1 kg)  07/12/22 258 lb (117 kg)    General: Alert, oriented, no distress.  Skin: normal turgor, no rashes, warm and dry HEENT: Normocephalic, atraumatic. Pupils equal round and reactive to light; sclera anicteric; extraocular muscles intact;  Nose without nasal septal hypertrophy Mouth/Parynx benign; Mallinpatti scale 3 Neck: No JVD, no carotid bruits; normal carotid upstroke Lungs: clear to ausculatation and percussion; no wheezing or rales Chest wall: without tenderness to palpitation Heart: PMI not displaced, RRR, s1 s2 normal, 1/6 systolic murmur, no diastolic murmur, no rubs, gallops, thrills, or heaves Abdomen: soft, nontender; no hepatosplenomehaly, BS+; abdominal aorta nontender and not dilated by palpation. Back: no CVA tenderness Pulses 2+ Musculoskeletal: full range of motion, normal strength, no joint deformities Extremities: Venous stasis changes left ankle no clubbing cyanosis or edema, Homan's sign negative  Neurologic: grossly nonfocal; Cranial nerves grossly wnl Psychologic: Normal mood and affect   Studies/Labs Reviewed:   I personally reviewed the ECG from January 18, 2023 which shows sinus bradycardia at 57 bpm.  No ectopy.  Jan 31, 2022 ECG (independently read by me):NSR at 67, normal intervals   Recent Labs:    Latest Ref Rng & Units 01/23/2023    8:28 AM 01/18/2023   11:54 AM 09/21/2021   12:59 AM  BMP  Glucose 70 - 99 mg/dL 88  82  161   BUN 6 - 24 mg/dL  13  14  16    Creatinine 0.57 - 1.00 mg/dL 0.96  0.45  4.09   BUN/Creat Ratio 9 - 23 22  24     Sodium 134 - 144 mmol/L 142  139  137   Potassium 3.5 -  5.2 mmol/L 4.9  5.5  3.8   Chloride 96 - 106 mmol/L 104  101  103   CO2 20 - 29 mmol/L 24  24  26    Calcium 8.7 - 10.2 mg/dL 9.6  9.8  9.1         Latest Ref Rng & Units 09/21/2021   12:59 AM  Hepatic Function  Total Protein 6.5 - 8.1 g/dL 7.7   Albumin 3.5 - 5.0 g/dL 3.9   AST 15 - 41 U/L 22   ALT 0 - 44 U/L 20   Alk Phosphatase 38 - 126 U/L 71   Total Bilirubin 0.3 - 1.2 mg/dL 0.1        Latest Ref Rng & Units 01/18/2023   11:54 AM 09/21/2021   12:59 AM  CBC  WBC 3.4 - 10.8 x10E3/uL 6.1  10.9   Hemoglobin 11.1 - 15.9 g/dL 40.9  81.1   Hematocrit 34.0 - 46.6 % 34.9  38.1   Platelets 150 - 450 x10E3/uL 467  448    Lab Results  Component Value Date   MCV 81 01/18/2023   MCV 84.1 09/21/2021   Lab Results  Component Value Date   TSH 2.090 01/18/2023   Lab Results  Component Value Date   HGBA1C 5.8 (A) 07/12/2022     BNP No results found for: "BNP"  ProBNP No results found for: "PROBNP"   Lipid Panel  No results found for: "CHOL", "TRIG", "HDL", "CHOLHDL", "VLDL", "LDLCALC", "LDLDIRECT", "LABVLDL"   RADIOLOGY: LONG TERM MONITOR (3-14 DAYS)  Result Date: 02/19/2023 Patch Wear Time:  13 days and 23 hours (2024-04-23T06:05:20-0400 to 2024-05-07T05:42:01-399) Patient had a min HR of 46 bpm, max HR of 146 bpm, and avg HR of 67 bpm. Predominant underlying rhythm was Sinus Rhythm. 4 Supraventricular Tachycardia runs occurred, the run with the fastest interval lasting 9 beats with a max rate of 146 bpm (avg 136 bpm); the run with the fastest interval was also the longest. Isolated SVEs were rare (<1.0%), SVE Couplets were rare (<1.0%), and SVE Triplets were rare (<1.0%). Isolated VEs were rare (<1.0%), and no VE Couplets or VE Triplets were present. Patient predominantly was in sinus rhythm with an average heart rate of  67, minimum heart rate at 46 at 3:14 AM while sleeping on May 1, and sinus tachycardia at 8:47 AM on January 28, 2019.  Of activity.  Brief runs of nonsustained VT with the fastest interval lasting 9 beats for 4.5 seconds at an average rate of 136 bpm on Feb 01, 2023 at 9:52 AM.  Rare isolated PACs, atrial couplets, rare isolated PVC and no evidence for ventricular couplets or triplets.  There were no episodes of atrial fibrillation.  There were no significant sinus pauses.     Additional studies/ records that were reviewed today include:    ECHO: 10/19/2021  1. Left ventricular ejection fraction, by estimation, is 55 to 60%. The  left ventricle has normal function. The left ventricle has no regional  wall motion abnormalities. Left ventricular diastolic parameters are  consistent with Grade I diastolic  dysfunction (impaired relaxation).   2. Right ventricular systolic function is normal. The right ventricular  size is normal.   3. The mitral valve is normal in structure. No evidence of mitral valve  regurgitation. No evidence of mitral stenosis.   4. The aortic valve is normal in structure. Aortic valve regurgitation is  not visualized. No aortic stenosis is present.   5. The inferior vena cava  is normal in size with greater than 50%  respiratory variability, suggesting right atrial pressure of 3 mmHg.   11/10/2021 CLINICAL INFORMATION Sleep Study Type: HST   Indication for sleep study: atrial fibrillation, snoring, morbid obesity   Epworth Sleepiness Score: 5   SLEEP STUDY TECHNIQUE A multi-channel overnight portable sleep study was performed. The channels recorded were: nasal airflow, thoracic respiratory movement, and oxygen saturation with a pulse oximetry. Snoring was also monitored.   MEDICATIONS Patient self administered medications include: N/A.   SLEEP ARCHITECTURE Patient was studied for 359.5 minutes. The sleep efficiency was 100.0 % and the patient was supine for 1.1%.  The arousal index was 0.0 per hour.   RESPIRATORY PARAMETERS The overall AHI was 15.4 per hour, with a central apnea index of 0 per hour.   The oxygen nadir was 76% during sleep.   CARDIAC DATA Mean heart rate during sleep was 65.6 bpm.   IMPRESSIONS - Moderate obstructive sleep apnea occurred during this study (AHI 15.4/h). The severity during REM sleep cannot be assessed on this study. - Severe oxygen desaturationto a nadir of 76%. - Patient snored for 47.1 minutes (13.1%) during the sleep.   DIAGNOSIS - Obstructive Sleep Apnea (G47.33) - Nocturnal Hypoxemia (G47.36)   RECOMMENDATIONS - In this patient with significant cardiovascular co-morbidities and severe nocturnal oxygen desaturation recommend therapeutic CPAP for treatment of her sleep disordered breathing. If unable to obtain an in-lab titration, initiate Auto-PAP with EPR of 3 at 7 - 16 cm of water. - Effort should be made to optimize nasal and orpharyngeal patency. - Avoid alcohol, sedatives and other CNS depressants that may worsen sleep apnea and disrupt normal sleep architecture. - Sleep hygiene should be reviewed to assess factors that may improve sleep quality. - Weight management (BMI 42) and regular exercise should be initiated or continued. - Recommend a download and sleep clinic evaluation after one month of therapy.      ASSESSMENT:    1. OSA (obstructive sleep apnea)   2. Paroxysmal atrial fibrillation (HCC)   3. Palpitations   4. SVT (supraventricular tachycardia)   5. Hypoxemia associated with sleep   6. Class II obesity     PLAN:  Ms. Angel Durham is a 52 year old female who has a history of GERD and migraines and in December 2022 was awakened from sleep with atrial fibrillation.  She underwent successful DC cardioversion and was on anticoagulation for 1 month.  She was subsequently found to have moderate overall obstructive sleep apnea and had severe oxygen desaturation to a nadir of 76% on her  home sleep study.  She initiated AutoPap therapy as an outpatient.  Set up date was December 26, 2021.  At her initial sleep evaluation with me in May 2023 a download confirmed that she was meeting compliance standards.  Usage has been 100%.  She uses a ResMed AirFit N30i mask and there is no leak.  Her AHI is excellent at 0.7 and her 95th percentile pressure is 11.1 with maximum average of 12.3.  During that evaluation I discussed optimal sleep duration at 7 and 9 hours and had a long discussion with her regarding potential adverse cardiovascular consequences of untreated sleep apnea.  I obtained a new download today from April 22 through Feb 19, 2023.  Usage continues to be 100%. Her ResMed AirSense 11 AutoSet unit is set at a pressure range of 7 to 16 cm of water.  AHI is excellent at 0.7 events per hour.  95th percentile pressure  is 11.5 with maximum average pressure 12.7.  [employer] is her DME company.  An Epworth scale calculated in the office today endorsed at 2 arguing against residual daytime sleepiness.  I reviewed recent laboratory from January 23, 2023 which showed BUN 13 creatinine 0.6.  Potassium was normal at 4.9 having previously been elevated on January 18, 2023 at 5.5. over the past year, she has had some success with weight loss with BMI previously at 41.8 and today at 39.38.  She is now on weekly Wegovy.  Her blood pressure today is stable on current therapy with nadolol 20 mg.  She has a prescription for diltiazem 30 mg to take on an as-needed basis for heart rate greater than 100.  She will follow-up with Dr. Tamera Punt in [place].  I will see her in 1 year for follow-up evaluation or sooner as needed.    Medication Adjustments/Labs and Tests Ordered: Current medicines are reviewed at length with the patient today.  Concerns regarding medicines are outlined above.  Medication changes, Labs and Tests ordered today are listed in the Patient Instructions below. There are no Patient Instructions on file  for this visit.   Signed, Nicki Guadalajara, MD, [facility], ABSM Diplomate, American Board of Sleep Medicine  02/26/2023 1:33 PM    [facility] [address], [address], [place], [place]  [address] Phone: [phone]

## 2023-02-26 ENCOUNTER — Encounter: Payer: Self-pay | Admitting: Cardiovascular Disease

## 2023-03-21 ENCOUNTER — Other Ambulatory Visit: Payer: Self-pay | Admitting: Sports Medicine

## 2023-03-21 DIAGNOSIS — M1612 Unilateral primary osteoarthritis, left hip: Secondary | ICD-10-CM

## 2023-05-31 ENCOUNTER — Other Ambulatory Visit: Payer: Self-pay | Admitting: Family Medicine

## 2023-05-31 NOTE — Progress Notes (Unsigned)
{  Select_TRH_Note:[id number]} 

## 2023-06-04 ENCOUNTER — Other Ambulatory Visit: Payer: Self-pay | Admitting: Family Medicine

## 2023-06-04 NOTE — Progress Notes (Signed)
Entered in Error

## 2023-06-11 ENCOUNTER — Telehealth: Payer: Self-pay | Admitting: *Deleted

## 2023-06-11 NOTE — Telephone Encounter (Signed)
Left message to call back to set up tele pre op appt.  

## 2023-06-11 NOTE — Telephone Encounter (Signed)
Patient is returning call. Please advise? 

## 2023-06-11 NOTE — Telephone Encounter (Signed)
Pt called back and has been scheduled for tele pre op appt 07/05/23 @ 9 am. Med rec and consent are done.     Patient Consent for Virtual Visit        Angel Durham has provided verbal consent on 06/11/2023 for a virtual visit (video or telephone).   CONSENT FOR VIRTUAL VISIT FOR:  Angel Durham  By participating in this virtual visit I agree to the following:  I hereby voluntarily request, consent and authorize [facility] and its employed or contracted physicians, physician assistants, nurse practitioners or other licensed health care professionals (the Practitioner), to provide me with telemedicine health care services (the "Services") as deemed necessary by the treating Practitioner. I acknowledge and consent to receive the Services by the Practitioner via telemedicine. I understand that the telemedicine visit will involve communicating with the Practitioner through live audiovisual communication technology and the disclosure of certain medical information by electronic transmission. I acknowledge that I have been given the opportunity to request an in-person assessment or other available alternative prior to the telemedicine visit and am voluntarily participating in the telemedicine visit.  I understand that I have the right to withhold or withdraw my consent to the use of telemedicine in the course of my care at any time, without affecting my right to future care or treatment, and that the Practitioner or I may terminate the telemedicine visit at any time. I understand that I have the right to inspect all information obtained and/or recorded in the course of the telemedicine visit and may receive copies of available information for a reasonable fee.  I understand that some of the potential risks of receiving the Services via telemedicine include:  Delay or interruption in medical evaluation due to technological equipment failure or disruption; Information transmitted may not be  sufficient (e.g. poor resolution of images) to allow for appropriate medical decision making by the Practitioner; and/or  In rare instances, security protocols could fail, causing a breach of personal health information.  Furthermore, I acknowledge that it is my responsibility to provide information about my medical history, conditions and care that is complete and accurate to the best of my ability. I acknowledge that Practitioner's advice, recommendations, and/or decision may be based on factors not within their control, such as incomplete or inaccurate data provided by me or distortions of diagnostic images or specimens that may result from electronic transmissions. I understand that the practice of medicine is not an exact science and that Practitioner makes no warranties or guarantees regarding treatment outcomes. I acknowledge that a copy of this consent can be made available to me via my patient portal [facility] MyChart), or I can request a printed copy by calling the office of [facility].    I understand that my insurance will be billed for this visit.   I have read or had this consent read to me. I understand the contents of this consent, which adequately explains the benefits and risks of the Services being provided via telemedicine.  I have been provided ample opportunity to ask questions regarding this consent and the Services and have had my questions answered to my satisfaction. I give my informed consent for the services to be provided through the use of telemedicine in my medical care

## 2023-06-11 NOTE — Telephone Encounter (Signed)
   Pre-operative Risk Assessment    Patient Name: Angel Durham  DOB: 14-May-1971 MRN: [id number]      Request for Surgical Clearance    Procedure:   Left Total Hip Replacement  Date of Surgery:  Clearance 07/24/23                                 Surgeon:  Dr. Sheria Lang Group or Practice Name:  [facility]  Phone number:  [phone] Fax number:  [phone]   Type of Clearance Requested:   - Medical  - Pharmacy:  Hold Aspirin Not Indicated   Type of Anesthesia:  Spinal   Additional requests/questions:    Signed, Emmit Pomfret   06/11/2023, 12:25 PM

## 2023-06-11 NOTE — Telephone Encounter (Signed)
Pt called back and has been scheduled for tele pre op appt 07/05/23 @ 9 am. Med rec and consent are done.

## 2023-06-11 NOTE — Telephone Encounter (Signed)
   Name: Angel Durham  DOB: December 27, 1970  MRN: [id number]  Primary Cardiologist: Rudi Coco, NP (Inactive)   Preoperative team, please contact this patient and set up a phone call appointment for further preoperative risk assessment. Please obtain consent and complete medication review. Thank you for your help.  I confirm that guidance regarding antiplatelet and oral anticoagulation therapy has been completed and, if necessary, noted below.  Per office protocol, if patient is without any new symptoms or concerns at the time of their virtual visit, he/she may hold ASA for 7 days prior to procedure. Please resume ASA as soon as possible postprocedure, at the discretion of the surgeon.    Joni Reining, NP 06/11/2023, 4:19 PM [facility]

## 2023-06-13 ENCOUNTER — Non-Acute Institutional Stay (HOSPITAL_COMMUNITY)
Admission: RE | Admit: 2023-06-13 | Discharge: 2023-06-13 | Disposition: A | Discharge status: Home / Self Care | Payer: 59 | Source: Ambulatory Visit | Attending: Internal Medicine | Admitting: Internal Medicine

## 2023-06-13 DIAGNOSIS — D509 Iron deficiency anemia, unspecified: Secondary | ICD-10-CM | POA: Diagnosis present

## 2023-06-13 MED ORDER — SODIUM CHLORIDE 0.9 % IV SOLN: INTRAVENOUS | Status: DC | PRN

## 2023-06-13 MED ORDER — IRON SUCROSE 500 MG IVPB - SIMPLE MED
500.0000 mg | Freq: Once | INTRAVENOUS | Status: AC
  Administered 2023-06-13: 500 mg via INTRAVENOUS
  Filled 2023-06-13: qty 275

## 2023-06-13 NOTE — Progress Notes (Addendum)
PATIENT CARE CENTER NOTE  Provider: Shelly Flatten, MD  Procedure: Venofer 500 mg   Note: Patient received Venofer 500 mg via PIV (dose #1 of 2). No pre-medications required per orders. Pt tolerated infusion with no adverse reaction.  AVS printed, and given to pt. Pt advised that she should return for second dose in 2 weeks or longer per orders, and pt can schedule next appointment at front desk.Pt is alert, oriented, and ambulatory at discharge.

## 2023-06-27 ENCOUNTER — Telehealth: Payer: Self-pay | Admitting: *Deleted

## 2023-06-27 NOTE — Telephone Encounter (Signed)
I s/w the pt and she has asked to move her tele appt out to later in Oct as her surgery is not planned for now until [facility]. Pt has been scheduled for 07/26/23.

## 2023-06-27 NOTE — Telephone Encounter (Signed)
Patient called and said that they have postponed her surgery to November. Want to know if she needs to take phone appt

## 2023-07-01 ENCOUNTER — Non-Acute Institutional Stay (HOSPITAL_COMMUNITY)
Admission: RE | Admit: 2023-07-01 | Discharge: 2023-07-01 | Disposition: A | Discharge status: Home / Self Care | Payer: 59 | Source: Ambulatory Visit | Attending: Internal Medicine | Admitting: Internal Medicine

## 2023-07-01 DIAGNOSIS — E611 Iron deficiency: Secondary | ICD-10-CM | POA: Insufficient documentation

## 2023-07-01 MED ORDER — NON FORMULARY: 500.0000 mg | Freq: Once | Status: DC

## 2023-07-01 MED ORDER — IRON SUCROSE 500 MG IVPB - SIMPLE MED
500.0000 mg | Freq: Once | INTRAVENOUS | Status: AC
  Administered 2023-07-01: 500 mg via INTRAVENOUS
  Filled 2023-07-01: qty 500

## 2023-07-01 MED ORDER — SODIUM CHLORIDE 0.9 % IV SOLN: INTRAVENOUS | Status: DC | PRN

## 2023-07-01 NOTE — Progress Notes (Signed)
PATIENT CARE CENTER NOTE   Provider: Shelly Flatten, MD   Procedure: Venofer 500 mg  Note: Patient received Venofer 500 mg via PIV (dose #2 of 2). Pt tolerated infusion with no adverse reaction. AVS offered, but pt declined. Pt is alert, oriented, and ambulatory at discharge.

## 2023-07-05 ENCOUNTER — Telehealth: Payer: 59

## 2023-07-11 ENCOUNTER — Other Ambulatory Visit: Payer: Self-pay | Admitting: Sports Medicine

## 2023-07-11 DIAGNOSIS — M1612 Unilateral primary osteoarthritis, left hip: Secondary | ICD-10-CM

## 2023-07-11 NOTE — Telephone Encounter (Signed)
To PCP, I have not seen this patient in over a year but PCP may be willing to fill

## 2023-07-25 NOTE — Progress Notes (Signed)
Virtual Visit via Telephone Note   Because of Angel Durham's co-morbid illnesses, she is at least at moderate risk for complications without adequate follow up.  This format is felt to be most appropriate for this patient at this time.  The patient did not have access to video technology/had technical difficulties with video requiring transitioning to audio format only (telephone).  All issues noted in this document were discussed and addressed.  No physical exam could be performed with this format.  Please refer to the patient's chart for her consent to telehealth for [facility].  Evaluation Performed:  Preoperative cardiovascular risk assessment _____________   Date:  07/25/2023   Patient ID:  Angel Durham, DOB Jun 20, 1971, MRN [id number] Patient Location:  Home Provider location:   Office  Primary Care Provider:  Charlton Amor, DO Primary Cardiologist:  Rudi Coco, NP (Inactive)  Chief Complaint / Patient Profile   52 y.o. y/o female with a h/o OSA on CPAP, GERD, palpitations, atrial fibrillation who is pending left total hip replacement with Dr. Thamas Jaegers and presents today for telephonic preoperative cardiovascular risk assessment.  History of Present Illness    Angel Durham is a 52 y.o. female who presents via audio/video conferencing for a telehealth visit today.  Pt was last seen in cardiology clinic on 02/19/23 by Dr. Tresa Endo.  At that time Gesenia Bonam was doing well.  The patient is now pending procedure as outlined above. Since her last visit, she denies chest pain, shortness of breath, lower extremity edema, fatigue, palpitations, melena, hematuria, hemoptysis, diaphoresis, weakness, presyncope, syncope, orthopnea, and PND. She is undergoing iron infusions and since that time she has been feeling improvement in energy. She had some feelings of presyncope prior to starting infusions but these have resolved.  She is able to achieve > 4 METS activity  without concerning cardiac symptoms.  Past Medical History    Past Medical History:  Diagnosis Date   Atrial fibrillation (HCC)    Gastroesophageal reflux    Migraine    Past Surgical History:  Procedure Laterality Date   CHOLECYSTECTOMY  2007   SPERMATIC VEIN LIGATION     TONSILLECTOMY      Allergies  Allergies  Allergen Reactions   Penicillins Rash    Home Medications    Prior to Admission medications   Medication Sig Start Date End Date Taking? Authorizing Provider  aspirin 81 MG EC tablet Take 81 mg by mouth daily.    [provider]  celecoxib (CELEBREX) 200 MG capsule ONE TO 2 CAPS BY MOUTH DAILY AS NEEDED FOR PAIN. 03/22/23   Monica Becton, MD  diltiazem (CARDIZEM) 30 MG tablet Take 1 tablet every 4 hours AS NEEDED for heart rate >100 09/26/21   Newman Nip, NP  ferrous sulfate 325 (65 FE) MG tablet Take 325 mg by mouth daily with breakfast.    [provider]  nadolol (CORGARD) 20 MG tablet Take 20 mg by mouth daily. 09/11/21   [provider]  omeprazole (PRILOSEC) 40 MG capsule Take 40 mg by mouth daily.    [provider]  ondansetron (ZOFRAN-ODT) 4 MG disintegrating tablet Take by mouth. 04/22/15   [provider]  Semaglutide,0.25 or 0.5MG /DOS, (OZEMPIC, 0.25 OR 0.5 MG/DOSE,) 2 MG/3ML SOPN Inject 0.25 mg into the skin once a week. Patient not taking: Reported on 01/18/2023 07/12/22   Charlton Amor, DO  Semaglutide,0.25 or 0.5MG /DOS, (OZEMPIC, 0.25 OR 0.5 MG/DOSE,) 2 MG/3ML SOPN Inject 0.25 mg into  the skin once a week. Patient not taking: Reported on 01/18/2023 07/12/22   Charlton Amor, DO  WEGOVY 0.25 MG/0.5ML SOAJ Inject 0.25 mg into the skin once a week. Use this dose for 1 month (4 shots) and then increase to next higher dose. Patient not taking: Reported on 01/18/2023 07/19/22   Charlton Amor, DO  zolmitriptan (ZOMIG) 5 MG nasal solution Place into the nose as needed for migraine. Patient not taking:  Reported on 02/19/2023    [provider]    Physical Exam    Vital Signs:  Daffney Coll does not have vital signs available for review today.  Given telephonic nature of communication, physical exam is limited. AAOx3. NAD. Normal affect.  Speech and respirations are unlabored.  Accessory Clinical Findings    None  Assessment & Plan    1.  Preoperative Cardiovascular Risk Assessment: According to the Revised Cardiac Risk Index (RCRI), her Perioperative Risk of Major Cardiac Event is (%): 0.9. Her Functional Capacity in METs is: 8.23 according to the Duke Activity Status Index (DASI). The patient is doing well from a cardiac perspective. Therefore, based on ACC/AHA guidelines, the patient would be at acceptable risk for the planned procedure without further cardiovascular testing.   The patient was advised that if she develops new symptoms prior to surgery to contact our office to arrange for a follow-up visit, and she verbalized understanding.  Per office protocol, he may hold aspirin for 5-7 days prior to procedure and should resume as soon as hemodynamically stable postoperatively.   A copy of this note will be routed to requesting surgeon.  Time:   Today, I have spent 10 minutes with the patient with telehealth technology discussing medical history, symptoms, and management plan.    Levi Aland, NP-C  07/26/2023, 8:59 AM [address], [address] Office [phone] Fax [phone]

## 2023-07-26 ENCOUNTER — Encounter: Payer: Self-pay | Admitting: Nurse Practitioner

## 2023-07-26 ENCOUNTER — Ambulatory Visit: Payer: 59 | Attending: Nurse Practitioner | Admitting: Nurse Practitioner

## 2023-07-26 DIAGNOSIS — Z01810 Encounter for preprocedural cardiovascular examination: Secondary | ICD-10-CM | POA: Diagnosis not present

## 2023-08-20 ENCOUNTER — Emergency Department (HOSPITAL_BASED_OUTPATIENT_CLINIC_OR_DEPARTMENT_OTHER): Payer: 59

## 2023-08-20 ENCOUNTER — Other Ambulatory Visit: Payer: Self-pay

## 2023-08-20 ENCOUNTER — Encounter (HOSPITAL_BASED_OUTPATIENT_CLINIC_OR_DEPARTMENT_OTHER): Payer: Self-pay | Admitting: Emergency Medicine

## 2023-08-20 ENCOUNTER — Emergency Department (HOSPITAL_BASED_OUTPATIENT_CLINIC_OR_DEPARTMENT_OTHER)
Admission: EM | Admit: 2023-08-20 | Discharge: 2023-08-20 | Disposition: A | Discharge status: Home / Self Care | Payer: 59 | Attending: Emergency Medicine | Admitting: Emergency Medicine

## 2023-08-20 ENCOUNTER — Encounter: Payer: Self-pay | Admitting: Cardiovascular Disease

## 2023-08-20 DIAGNOSIS — R002 Palpitations: Secondary | ICD-10-CM | POA: Diagnosis present

## 2023-08-20 LAB — CBC
HCT: 41.3 % (ref 36.0–46.0)
Hemoglobin: 13.8 g/dL (ref 12.0–15.0)
MCH: 29.3 pg (ref 26.0–34.0)
MCHC: 33.4 g/dL (ref 30.0–36.0)
MCV: 87.7 fL (ref 80.0–100.0)
Platelets: 431 10*3/uL — ABNORMAL HIGH (ref 150–400)
RBC: 4.71 MIL/uL (ref 3.87–5.11)
RDW: 17.9 % — ABNORMAL HIGH (ref 11.5–15.5)
WBC: 9.9 10*3/uL (ref 4.0–10.5)
nRBC: 0 % (ref 0.0–0.2)

## 2023-08-20 LAB — BASIC METABOLIC PANEL
Anion gap: 11 (ref 5–15)
BUN: 13 mg/dL (ref 6–20)
CO2: 23 mmol/L (ref 22–32)
Calcium: 9.2 mg/dL (ref 8.9–10.3)
Chloride: 102 mmol/L (ref 98–111)
Creatinine, Ser: 0.54 mg/dL (ref 0.44–1.00)
GFR, Estimated: >60 mL/min (ref >60)
Glucose, Bld: 137 mg/dL — ABNORMAL HIGH (ref 70–99)
Potassium: 3.6 mmol/L (ref 3.5–5.1)
Sodium: 136 mmol/L (ref 135–145)

## 2023-08-20 MED ORDER — METOPROLOL TARTRATE 25 MG PO TABS
25.0000 mg | ORAL_TABLET | Freq: Once | ORAL | Status: AC
  Administered 2023-08-20: 25 mg via ORAL
  Filled 2023-08-20: qty 1

## 2023-08-20 MED ORDER — METOPROLOL TARTRATE 25 MG PO TABS: 25.0000 mg | ORAL_TABLET | Freq: Two times a day (BID) | ORAL | 0 refills | Status: DC

## 2023-08-20 NOTE — ED Notes (Signed)
Pt. Is alert and oriented with reports of feeling like her HR started to go up around 5pm today.  Pt. Is in no pain and has had no chest pain,

## 2023-08-20 NOTE — ED Provider Notes (Signed)
[place] [facility] AT [facility] Provider Note   CSN: [id number] Arrival date & time: 08/20/23  2108     History Chief Complaint  Patient presents with   Palpitations    HPI Angel Durham is a 52 y.o. female presenting for palpitations. See same day triage note  Patient's recorded medical, surgical, social, medication list and allergies were reviewed in the Snapshot window as part of the initial history.   Review of Systems   Review of Systems  Constitutional:  Negative for chills and fever.  HENT:  Negative for ear pain and sore throat.   Eyes:  Negative for pain and visual disturbance.  Respiratory:  Positive for cough and shortness of breath.   Cardiovascular:  Positive for palpitations. Negative for chest pain.  Gastrointestinal:  Negative for abdominal pain and vomiting.  Genitourinary:  Negative for dysuria and hematuria.  Musculoskeletal:  Negative for arthralgias and back pain.  Skin:  Negative for color change and rash.  Neurological:  Negative for seizures and syncope.  All other systems reviewed and are negative.   Physical Exam Updated Vital Signs BP (!) 140/75   Pulse 93   Temp 97.7 F (36.5 C)   Resp 16   Ht 5\' 6"  (1.676 m)   Wt 104.3 kg   LMP 09/25/2014   SpO2 100%   BMI 37.12 kg/m  Physical Exam Vitals and nursing note reviewed.  Constitutional:      General: She is not in acute distress.    Appearance: She is well-developed.  HENT:     Head: Normocephalic and atraumatic.  Eyes:     Conjunctiva/sclera: Conjunctivae normal.  Cardiovascular:     Rate and Rhythm: Normal rate and regular rhythm.     Heart sounds: No murmur heard. Pulmonary:     Effort: Pulmonary effort is normal. No respiratory distress.     Breath sounds: Normal breath sounds.  Abdominal:     Palpations: Abdomen is soft.     Tenderness: There is no abdominal tenderness.  Musculoskeletal:        General: No swelling.     Cervical back: Neck  supple.  Skin:    General: Skin is warm and dry.     Capillary Refill: Capillary refill takes less than 2 seconds.  Neurological:     Mental Status: She is alert.  Psychiatric:        Mood and Affect: Mood normal.      ED Course/ Medical Decision Making/ A&P    Procedures Procedures   Medications Ordered in ED Medications  metoprolol tartrate (LOPRESSOR) tablet 25 mg (has no administration in time range)    Medical Decision Making:   Patient presenting with palpitations.  She was worried it was her A-fib again as she had had a historical episode of A-fib with RVR. However she states today felt substantially more mild.  She took her home diltiazem and heart rate improved.  By time of arrival she is down in the 90s.  She denies fevers chills nausea vomiting shortness of breath.  States she had her baseline.  States that she just wanted to be checked out tonight to see if she needs to go back on the nadolol she used to be on for migraines.  She was taken off of it last month because she was having episodes of hypotension. She currently is in no acute distress ambulatory tolerating p.o. intake. Shared medical decision making, could be more aggressive in the workup today  to look at pulmonary embolism, more sinister pathology but she stated that she would feel comfortable going on a beta-blocker and following up in the outpatient setting declined to be safe prior to her surgery.  Stated we could start her on metoprolol p.o. which should have less blood pressure effect and have her follow-up with her PCP within 48 hours to reassess and patient feels comfortable this plan.  Disposition:  I have considered need for hospitalization, however, considering all of the above, I believe this patient is stable for discharge at this time.  Patient/family educated about specific return precautions for given chief complaint and symptoms.  Patient/family educated about follow-up with PCP.      Patient/family expressed understanding of return precautions and need for follow-up. Patient spoken to regarding all imaging and laboratory results and appropriate follow up for these results. All education provided in verbal form with additional information in written form. Time was allowed for answering of patient questions. Patient discharged.    Emergency Department Medication Summary:   Medications  metoprolol tartrate (LOPRESSOR) tablet 25 mg (has no administration in time range)        Clinical Impression:  1. Palpitations      Discharge   Final Clinical Impression(s) / ED Diagnoses Final diagnoses:  Palpitations    Rx / DC Orders ED Discharge Orders          Ordered    metoprolol tartrate (LOPRESSOR) 25 MG tablet  2 times daily        08/20/23 2306              Glyn Ade, MD 08/20/23 2310

## 2023-08-20 NOTE — ED Triage Notes (Signed)
Pt c/o palpitations since 1700 tonight. States that she has not had an episode of afib since last December. Pt takes 81mg  ASA everyday.   Took cardizem a little over an hour pta without feeling a change.

## 2023-08-20 NOTE — ED Provider Triage Note (Signed)
Emergency Medicine Provider Triage Evaluation Note  Angel Durham , a 52 y.o. female  was evaluated in triage.  Pt complains of palpitations. Started at 1700, took 30mg  of diltiazem at 1930 without response Recently discontinued her nadolol in the OP setting. Taken off for low BP: Lost 35 lbs and BP restored.   Review of Systems  Positive: Palpitations,  Negative:   Physical Exam  BP (!) 140/75   Pulse 93   Temp 97.7 F (36.5 C)   Resp 16   Ht 5\' 6"  (1.676 m)   Wt 104.3 kg   LMP 09/25/2014   SpO2 100%   BMI 37.12 kg/m  Gen:   Awake, no distress   Resp:  Normal effort  MSK:   Moves extremities without difficulty  Other:    Medical Decision Making  Medically screening exam initiated at 10:49 PM.  Appropriate orders placed.  Angel Durham was informed that the remainder of the evaluation will be completed by another provider, this initial triage assessment does not replace that evaluation, and the importance of remaining in the ED until their evaluation is complete.     Angel Ade, MD 08/20/23 [phone]

## 2023-08-21 ENCOUNTER — Telehealth: Payer: Self-pay | Admitting: Student

## 2023-08-21 NOTE — Telephone Encounter (Signed)
Spoke to patient who was seen in the ED 11/19 for palpitations. She was discharged and they asked that she follow up in the office. She is scheduled for hip replacement surgery 11/27 and the provider doing the hip replacement would like her seen also. Patient was scheduled for 11/21 at [facility] with Eligha Bridegroom, NP.

## 2023-08-21 NOTE — Telephone Encounter (Signed)
Patient c/o Palpitations:  STAT if patient reporting lightheadedness, shortness of breath, or chest pain  How long have you had palpitations/irregular HR/ Afib? Are you having the symptoms now? Went to ER last night; no   Are you currently experiencing lightheadedness, SOB or CP? CP  Do you have a history of afib (atrial fibrillation) or irregular heart rhythm? Yes   Have you checked your BP or HR? (document readings if available): 144/75; 80  Are you experiencing any other symptoms? No

## 2023-08-22 ENCOUNTER — Ambulatory Visit: Payer: 59 | Attending: Nurse Practitioner | Admitting: Nurse Practitioner

## 2023-08-22 ENCOUNTER — Encounter: Payer: Self-pay | Admitting: Nurse Practitioner

## 2023-08-22 VITALS — BP 122/78 | HR 74 | Ht 66.0 in | Wt 231.0 lb

## 2023-08-22 DIAGNOSIS — Z01810 Encounter for preprocedural cardiovascular examination: Secondary | ICD-10-CM | POA: Diagnosis not present

## 2023-08-22 DIAGNOSIS — R002 Palpitations: Secondary | ICD-10-CM

## 2023-08-22 DIAGNOSIS — R000 Tachycardia, unspecified: Secondary | ICD-10-CM

## 2023-08-22 DIAGNOSIS — I480 Paroxysmal atrial fibrillation: Secondary | ICD-10-CM | POA: Diagnosis not present

## 2023-08-22 DIAGNOSIS — G4733 Obstructive sleep apnea (adult) (pediatric): Secondary | ICD-10-CM

## 2023-08-22 DIAGNOSIS — I10 Essential (primary) hypertension: Secondary | ICD-10-CM

## 2023-08-22 MED ORDER — METOPROLOL TARTRATE 25 MG PO TABS: 25.0000 mg | ORAL_TABLET | Freq: Two times a day (BID) | ORAL | 3 refills | Status: AC

## 2023-08-22 NOTE — Patient Instructions (Signed)
Medication Instructions:   Your physician recommends that you continue on your current medications as directed. Please refer to the Current Medication list given to you today.   *If you need a refill on your cardiac medications before your next appointment, please call your pharmacy*   Lab Work:  None ordered.  If you have labs (blood work) drawn today and your tests are completely normal, you will receive your results only by: MyChart Message (if you have MyChart) OR A paper copy in the mail If you have any lab test that is abnormal or we need to change your treatment, we will call you to review the results.   Testing/Procedures:  None ordered.   Follow-Up: At [facility], you and your health needs are our priority.  As part of our continuing mission to provide you with exceptional heart care, we have created designated Provider Care Teams.  These Care Teams include your primary Cardiologist (physician) and Advanced Practice Providers (APPs -  Physician Assistants and Nurse Practitioners) who all work together to provide you with the care you need, when you need it.  We recommend signing up for the patient portal called "MyChart".  Sign up information is provided on this After Visit Summary.  MyChart is used to connect with patients for Virtual Visits (Telemedicine).  Patients are able to view lab/test results, encounter notes, upcoming appointments, etc.  Non-urgent messages can be sent to your provider as well.   To learn more about what you can do with MyChart, go to [email or web address].    Your next appointment:   4 month(s)  Provider:   Nicki Guadalajara, MD

## 2023-08-22 NOTE — Progress Notes (Signed)
Cardiology Office Note:  .   Date:  08/22/2023  ID:  Angel Durham, DOB Sep 24, 1971, MRN [id number] PCP: Charlton Amor, DO  [facility] Providers Cardiologist:  Rudi Coco, NP (Inactive) Sleep Medicine:  Nicki Guadalajara, MD    Patient Profile: .      PMH Palpitations Atrial fibrillation OSA on CPAP GERD Migraine headache  She presented to the emergency room on September 21, 2021 and was found to be in atrial fibrillation.  Laboratory studies were unremarkable.  She underwent successful DCCV with restoration of sinus rhythm.  She subsequently presented to A-fib clinic on September 26, 2021.  ECG showed sinus rhythm.  She denied recurrent episodes of palpitations.  There was no history of tobacco or alcohol use or excessive caffeine use.  She was started on Eliquis for 4 weeks.  CHA2DS2-VASc score was 1.  She underwent TTE 10/19/2021 which showed normal LV function with EF 55 to 60% and grade 1 diastolic dysfunction.  Seen by Dr. Tresa Endo for initial sleep evaluation 01/31/2022.  Home sleep study revealed moderate sleep apnea with an AHI of 15.4 and significant oxygen desaturation to nadir of 76%.  She was started on CPAP.  Seen by Carlyon Shadow, NP 01/18/23 after she experienced lightheadedness after donating blood on 12/25/2022.  Later that evening she felt her heart racing and a sensation of feeling flushed.  ZIO monitor revealed normal sinus rhythm with average HR 67 bpm, 4 brief episodes of SVT rare PACs and PVCs.  Last cardiology clinic visit was 02/19/2023 with Dr. Tresa Endo.  She was having excellent response to CPAP.  She was taking Wegovy for weight loss.  BP was stable.  She continued prescription for diltiazem 30 mg as needed for HR greater than 100. One year follow-up was recommended.        History of Present Illness: .   Angel Durham is a pleasant 52 y.o. female who is here today for preoperative cardiac evaluation. She was cleared by me through preoperative telephone  encounter on 07/26/2023 for upcoming left total hip replacement. Unfortunately, since that time she was seen in ED on 08/20/2023 for palpitations. She recently discontinued nadolol under the direction of her PCP due to low blood pressure and weight loss. Since then, she has experienced facial flushing and an increased heart rate, with episodes of HR consistently staying above 90 and reaching up to 117. Two EKGs on 11/19 and 11/21 have indicated potential infarcts, but the patient has not experienced any chest pain or shortness of breath. Both of these EKGs revealed sinus tachycardia. Prior EKG for comparison from 01/18/23 revealed sinus bradycardia at 57 bpm.  There was no mention of evidence of anterior infarct. In ED on 11/19, she was prescribed metoprolol, which has helped to reduce HR. She is currently feeling well.  She has not had any concerning cardiac symptoms including chest pain, shortness of breath, orthopnea, PND, edema, presyncope, or syncope. She has noticed that her CPAP machine seems to be blowing air out of her mouth at times. She will also wake up burping air in the middle of the night. She has successfully lost weight over the past few months.   Discussed the use of AI scribe software for clinical note transcription with the patient, who gave verbal consent to proceed.   ROS: See HPI       Studies Reviewed: .        Risk Assessment/Calculations:    CHA2DS2-VASc Score = 1   This indicates a  0.6% annual risk of stroke. The patient's score is based upon: CHF History: 0 HTN History: 0 Diabetes History: 0 Stroke History: 0 Vascular Disease History: 0 Age Score: 0 Gender Score: 1             Physical Exam:   VS:  BP 122/78   Pulse 74   Ht 5\' 6"  (1.676 m)   Wt 231 lb (104.8 kg)   LMP 09/25/2014   SpO2 100%   BMI 37.28 kg/m    Wt Readings from Last 3 Encounters:  08/22/23 231 lb (104.8 kg)  08/20/23 230 lb (104.3 kg)  02/19/23 244 lb (110.7 kg)    GEN: Well  nourished, well developed in no acute distress NECK: No JVD; No carotid bruits CARDIAC: RRR, no murmurs, rubs, gallops RESPIRATORY:  Clear to auscultation without rales, wheezing or rhonchi  ABDOMEN: Soft, non-tender, non-distended EXTREMITIES:  No edema; No deformity     ASSESSMENT AND PLAN: .    Preoperative cardiac evaluation:  [name] continues to be able to achieve > 4 METS activity without concerning cardiac symptoms. Her HR has improved with the addition of metoprolol. Based on ACC/AHA guidelines, she is at acceptable risk for the planned procedure without further cardiovascular testing. I will route this recommendation to the requesting provider. She may hold aspirin for 5-7 days prior to procedure and should resume as soon as hemodynamically stable postoperatively.  Tachycardia: Recent increase in heart rate after discontinuation of Nadolol due to low BP and weight loss. She experienced episodes of heart rate above 90, reaching up to 117 at rest. No associated chest pain or shortness of breath. ED visit 11/19 with EKG that revealed sinus tachycardia at 103 bpm, cannot rule out anterior infarct. We discussed limiting caffeine. She does not drink Etoh. She was prescribed metoprolol 25mg  twice daily which is working well for her. HR is well controlled today.  Advised her to notify us if she has any concerns prior to surgery. We will refill her metoprolol.   PAF: Reports no recent symptoms like she previously experienced with a fib which included lightheadedness along with palpitations.  No evidence of A-fib on EKG.  She will continue to monitor.  She is not on anticoagulation in the setting of CHA2DS2-VASc score of 1.  OSA on CPAP: Recent issues with CPAP use, including air leakage and feeling like she is burping air at night. No change in daytime symptoms. Advised that possibly her recent weight loss has caused her mask to leak. I will forward note to Dr. Tresa Endo for his awareness.    Hypertension: BP was initially elevated but improved upon my recheck.  She has had stable BP readings at home.       Dispo: 3-4 months with Dr. Tresa Endo  Signed, Eligha Bridegroom, NP-C

## 2023-09-28 IMAGING — DX DG CHEST 1V PORT
1 series · 1 of 1 positions shown · non-contrast
Comparison: None.

CLINICAL DATA: Cardiac palpitations

EXAM:
PORTABLE CHEST 1 VIEW

[chest ap]
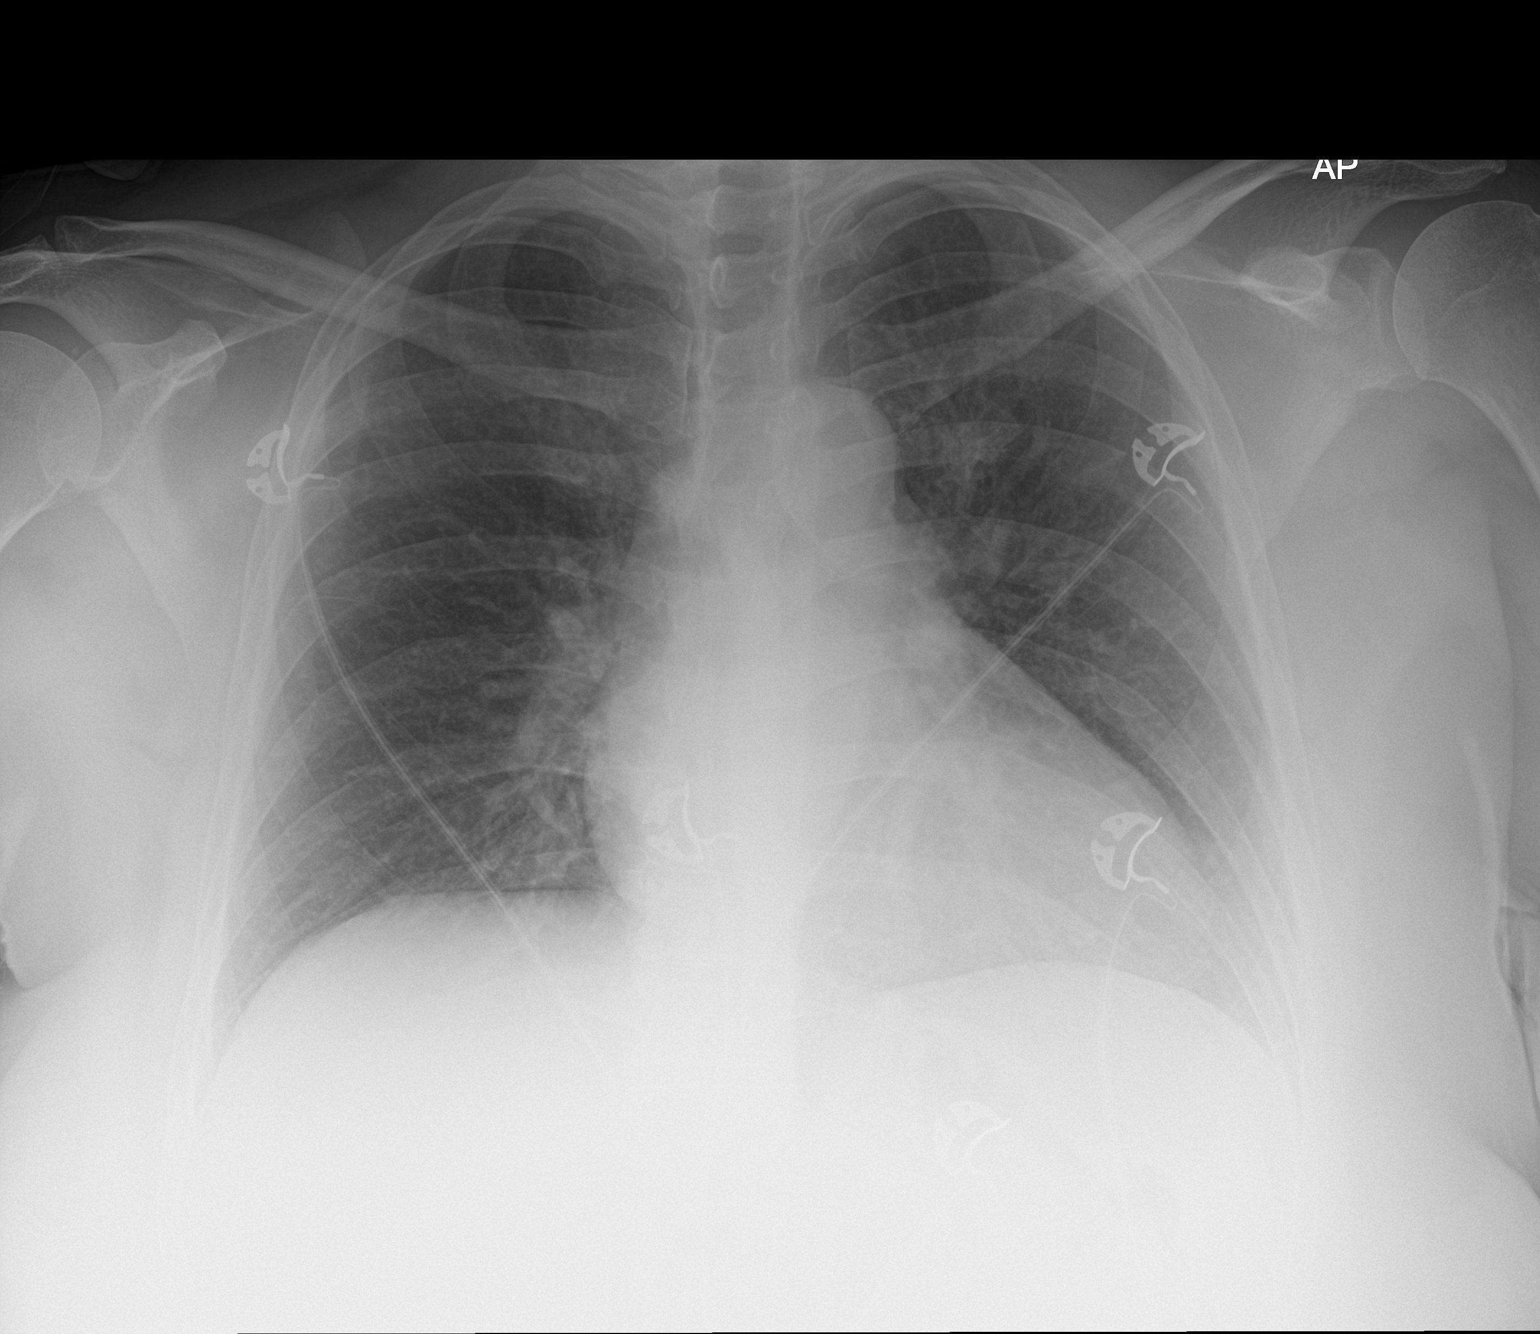

[1 of 1 positions shown; findings below may reference images not displayed]

FINDINGS: The heart size and mediastinal contours are within normal limits.
Both lungs are clear. The visualized skeletal structures are
unremarkable.
IMPRESSION: No active disease.

## 2023-11-19 ENCOUNTER — Other Ambulatory Visit: Payer: Self-pay | Admitting: Internal Medicine

## 2023-11-19 ENCOUNTER — Ambulatory Visit
Admission: RE | Admit: 2023-11-19 | Discharge: 2023-11-19 | Disposition: A | Discharge status: Home / Self Care | Payer: 59 | Source: Ambulatory Visit | Attending: Internal Medicine | Admitting: Internal Medicine

## 2023-11-19 DIAGNOSIS — E041 Nontoxic single thyroid nodule: Secondary | ICD-10-CM

## 2023-12-20 ENCOUNTER — Ambulatory Visit: Payer: 59 | Attending: Cardiovascular Disease | Admitting: Cardiovascular Disease

## 2023-12-20 ENCOUNTER — Encounter: Payer: Self-pay | Admitting: Cardiovascular Disease

## 2023-12-20 DIAGNOSIS — E66812 Obesity, class 2: Secondary | ICD-10-CM | POA: Diagnosis not present

## 2023-12-20 DIAGNOSIS — R002 Palpitations: Secondary | ICD-10-CM | POA: Diagnosis not present

## 2023-12-20 DIAGNOSIS — I471 Supraventricular tachycardia, unspecified: Secondary | ICD-10-CM

## 2023-12-20 DIAGNOSIS — G4733 Obstructive sleep apnea (adult) (pediatric): Secondary | ICD-10-CM | POA: Diagnosis not present

## 2023-12-20 DIAGNOSIS — I480 Paroxysmal atrial fibrillation: Secondary | ICD-10-CM

## 2023-12-20 DIAGNOSIS — G4736 Sleep related hypoventilation in conditions classified elsewhere: Secondary | ICD-10-CM

## 2023-12-20 MED ORDER — METOPROLOL SUCCINATE ER 25 MG PO TB24: ORAL_TABLET | ORAL | 3 refills | Status: AC

## 2023-12-20 NOTE — Patient Instructions (Addendum)
 Medication Instructions:  Begin Metoprolol Succinate 25mg  daily for palpitations.  If you're still experiencing palpitations on the 25mg  daily, you make take 37.5mg  (1 1/2 tablet)  *If you need a refill on your cardiac medications before your next appointment, please call your pharmacy*   Lab Work: No labs were ordered during today's visit.  If you have labs (blood work) drawn today and your tests are completely normal, you will receive your results only by: MyChart Message (if you have MyChart) OR A paper copy in the mail If you have any lab test that is abnormal or we need to change your treatment, we will call you to review the results.   Testing/Procedures: No procedures were ordered during today's visit.    Follow-Up: At [facility], you and your health needs are our priority.  As part of our continuing mission to provide you with exceptional heart care, we have created designated Provider Care Teams.  These Care Teams include your primary Cardiologist (physician) and Advanced Practice Providers (APPs -  Physician Assistants and Nurse Practitioners) who all work together to provide you with the care you need, when you need it.  We recommend signing up for the patient portal called "MyChart".  Sign up information is provided on this After Visit Summary.  MyChart is used to connect with patients for Virtual Visits (Telemedicine).  Patients are able to view lab/test results, encounter notes, upcoming appointments, etc.  Non-urgent messages can be sent to your provider as well.   To learn more about what you can do with MyChart, go to [email or web address].    Your next appointment:   8 month(s)  Provider:   Jodelle Red, MD    You may see Dr. Armanda Magic as needed for sleep concerns   If you have any questions or concerns regarding your c-pap, bi-pap or sleep accessories, please contact Brandie Rorie at [phone].     Other Instructions Thank you for  choosing [facility]!

## 2023-12-20 NOTE — Progress Notes (Signed)
 Cardiology Office Note    Date:  12/20/2023   ID:  Angel Durham, DOB 04/07/1971, MRN [id number]  PCP:  Charlton Amor, DO  Cardiologist:  Nicki Guadalajara, MD (sleep); Rudi Coco, [facility]  10 month follow-up sleep evaluation   History of Present Illness:  Angel Durham is a 53 y.o. female who has a history of GERD, migraine headaches, and recently had developed palpitations and weakness.  She presented to the emergency room on September 21, 2021 and was found to be in atrial fibrillation.  She was started on a Cardizem drip with some improvement in her rapid heart rate.  Laboratory studies were unremarkable.  She underwent successful DC cardioversion with restoration of sinus rhythm.  She subsequently presented to atrial fibrillation clinic and was evaluated by Rudi Coco, NP on September 26, 2021.  ECG showed sinus rhythm.  She denied any recurrent episodes of palpitations.  There was no history of tobacco or alcohol use or excessive caffeine use.  Upon questioning it was noted that she doors and when her symptoms had initially occurred she was awakened from sleep with atrial fibrillation.  She was started on Eliquis for 4 weeks.  CHA2DS2-VASc score was 1.  She underwent a 2D echo Doppler study on October 19, 2021 which showed normal LV function with EF 55 to 60% and grade 1 diastolic relaxation action.    I saw her for my initial sleep evaluation on Jan 31, 2022.  With her awakening from sleep with atrial fibrillation and history of snoring she was referred for a sleep study  on November 10, 2021 and was a home study.  This revealed moderate overall sleep apnea with an AHI of 15.4.  She had significant oxygen desaturation to a nadir of 76%.  The severity during REM sleep could not be assessed on this study.  She snored for 47.1 minutes of her sleep duration representing 13.1%.  She subsequently initiated AutoPap therapy and had a CPAP set up date on December 26, 2021 with a ResMed air  sense 11 AutoSet CPAP unit.  Her initial pressure to a range of 7 to 16 cm of water.  She has been using a ResMed AirFit N30i mask.  A download was obtained from March 28 through January 24, 2022.  Compliance is excellent at 100%.  Average sleep duration with CPAP was 6 hours and 42 minutes.  Her 95th percentile pressure was 11.1 and maximum average pressure 12.3 cm of water.  AHI was excellent at 0.7/h.  Since initiating CPAP therapy she has felt improved.  She was unaware of any residual daytime snoring.  She denies any recurrent palpitations or atrial fibrillation.  Typically she goes to bed at 10 PM and wakes up at 5:30 AM.  She is on a baby aspirin, nadolol 20 mg daily, and has a prescription for diltiazem 30 mg to take as needed with heart rate greater than 100.  During that evaluation, I had an extensive discussion with her regarding sleep apnea and its effects on sleep architecture as well as potential adverse cardiovascular consequences.  In particular I discussed its effects related to blood pressure elevation, potential for nocturnal palpitations, increased risk for atrial fibrillation as well as increased risk for inflammation, insulin resistance, GERD, and the potential for nocturnal ischemia in the setting of apnea precipitated nocturnal hypoxia.  Since my initial evaluation, she has been evaluated by Carlyon Shadow, NP.  She had experienced an episode of lightheadedness after donating  blood on December 25, 2022.  Later that evening she felt her heart racing and a sensation of a flushed feeling.  She purchased a Kardia mobile device which had not indicated A-fib.  She ultimately was referred for a Zio patch monitor which she wore from April 23 through Feb 05, 2023.  The predominant rhythm was sinus rhythm with an average rate at 67 bpm.  Her slowest heart rate was sinus bradycardia while sleeping with heart rate at 46 and fastest sinus rhythm was 111 bpm.  She had 4 brief episodes of SVT with the run  with the fastest interval lasting 9 beats with a maximum rate at 146 (average 136.  She had rare PACs and atrial couplets and very rare isolated PVCs.  I last saw her on Feb 19, 2023.  At that time she felt well and denied any further awareness of recurrent palpitations.  She was now seeing  Stephens Shire, DO for primary care in [place].  Since I last saw her, he has continued to feel well.  However, on August 20, 2023 she had an episode of increased palpitations leading to ER evaluation.  She took her home as needed dose of diltiazem and heart rate improved to the 90s upon arrival to the ER.  During that evaluation she was given metoprolol to tartrate 25 mg and was told to take twice a day.  Remotely, she had been on nadolol but this had been discontinued.  She has continued to use CPAP therapy.  She is in need to change to a new DME company since [facility] [employer] is no longer in the business and based on insurance apparently will be set up with [employer].  She continues to use CPAP and a download from February 18 through December 18, 2023 shows 100% use with average use of 8 hours and 10 minutes.  Her pressure is set at a range of 7 to 16 cm of water.  AHI is excellent at 0.5.  95th percentile pressure is 12.8 with maximum average pressure 14.0.  She presents for follow-up evaluation.   Past Medical History:  Diagnosis Date   Atrial fibrillation (HCC)    Gastroesophageal reflux    Migraine     Past Surgical History:  Procedure Laterality Date   CHOLECYSTECTOMY  2007   SPERMATIC VEIN LIGATION     TONSILLECTOMY      Current Medications: Outpatient Medications Prior to Visit  Medication Sig Dispense Refill   aspirin 81 MG EC tablet Take 81 mg by mouth daily.     diltiazem (CARDIZEM) 30 MG tablet Take 1 tablet every 4 hours AS NEEDED for heart rate >100 30 tablet 1   ferrous sulfate 325 (65 FE) MG tablet Take 325 mg by mouth daily with breakfast.     metoprolol tartrate (LOPRESSOR) 25 MG  tablet Take 1 tablet (25 mg total) by mouth 2 (two) times daily. 180 tablet 3   omeprazole (PRILOSEC) 40 MG capsule Take 40 mg by mouth daily.     ondansetron (ZOFRAN-ODT) 4 MG disintegrating tablet Take by mouth.     celecoxib (CELEBREX) 200 MG capsule ONE TO 2 CAPS BY MOUTH DAILY AS NEEDED FOR PAIN. 60 capsule 2   No facility-administered medications prior to visit.     Allergies:   Penicillins   Social History   Socioeconomic History   Marital status: Married    Spouse name: Not on file   Number of children: Not on file   Years of  education: Not on file   Highest education level: Not on file  Occupational History   Not on file  Tobacco Use   Smoking status: Former   Smokeless tobacco: Never   Tobacco comments:    1 pack a day for 5 years  Substance and Sexual Activity   Alcohol use: Yes    Comment: rarely   Drug use: Not Currently    Types: Marijuana    Comment: former   Sexual activity: Not Currently    Partners: Male  Other Topics Concern   Not on file  Social History Narrative   Not on file   Social Drivers of Health   Financial Resource Strain: Not on file  Food Insecurity: Low Risk  (08/28/2023)   Received from [facility]   Hunger Vital Sign    Worried About Running Out of Food in the Last Year: Never true    Ran Out of Food in the Last Year: Never true  Transportation Needs: No Transportation Needs (08/28/2023)   Received from [facility]    In the past 12 months, has lack of reliable transportation kept you from medical appointments, meetings, work or from getting things needed for daily living? : No  Physical Activity: Not on file  Stress: Not on file  Social Connections: Not on file     Socially she was born in [place], [place].  She is married and has an 75 year old child.  Family History: Family history is notable that her father is 26 and mother 76.  Her maternal grand father had a myocardial infarction in his 53s.   Grandmother had COPD.  ROS General: Negative; No fevers, chills, or night sweats;  HEENT: Negative; No changes in vision or hearing, sinus congestion, difficulty swallowing Pulmonary: Negative; No cough, wheezing, shortness of breath, hemoptysis Cardiovascular: PAF x1, no chest pain or shortness of breath. GI: Negative; No nausea, vomiting, diarrhea, or abdominal pain GU: Negative; No dysuria, hematuria, or difficulty voiding Musculoskeletal: Negative; no myalgias, joint pain, or weakness Hematologic/Oncology: Negative; no easy bruising, bleeding Endocrine: Negative; no heat/cold intolerance; no diabetes Neuro: Negative; no changes in balance, headaches Skin: Negative; No rashes or skin lesions Psychiatric: Negative; No behavioral problems, depression Sleep: OSA; history of snoring.  Since initiating CPAP, sleeping well; notresidual daytime sleepiness.No bruxism, restless legs, hypnogognic hallucinations, no cataplexy Other comprehensive 14 point system review is negative.   PHYSICAL EXAM:   VS:  BP 110/68   Pulse 62   Ht 5' 6.5" (1.689 m)   Wt 243 lb 6.4 oz (110.4 kg)   LMP 09/25/2014   SpO2 99%   BMI 38.70 kg/m     Repeat blood pressure by me was 118/70  Wt Readings from Last 3 Encounters:  12/20/23 243 lb 6.4 oz (110.4 kg)  08/22/23 231 lb (104.8 kg)  08/20/23 230 lb (104.3 kg)    General: Alert, oriented, no distress.  Skin: normal turgor, no rashes, warm and dry HEENT: Normocephalic, atraumatic. Pupils equal round and reactive to light; sclera anicteric; extraocular muscles intact;  Nose without nasal septal hypertrophy Mouth/Parynx benign; Mallinpatti scale 3 Neck: No JVD, no carotid bruits; normal carotid upstroke Lungs: clear to ausculatation and percussion; no wheezing or rales Chest wall: without tenderness to palpitation Heart: PMI not displaced, RRR, s1 s2 normal, 1/6 systolic murmur, no diastolic murmur, no rubs, gallops, thrills, or heaves Abdomen: soft,  nontender; no hepatosplenomehaly, BS+; abdominal aorta nontender and not dilated by palpation. Back: no CVA tenderness Pulses  2+ Musculoskeletal: full range of motion, normal strength, no joint deformities Extremities: Venous stasis changes left ankle no clubbing cyanosis or edema, Homan's sign negative  Neurologic: grossly nonfocal; Cranial nerves grossly wnl Psychologic: Normal mood and affect   Studies/Labs Reviewed:   EKG Interpretation Date/Time:  Friday December 20 2023 13:34:36 EDT Ventricular Rate:  62 PR Interval:  142 QRS Duration:  100 QT Interval:  428 QTC Calculation: 434 R Axis:   96  Text Interpretation: Normal sinus rhythm Rightward axis When compared with ECG of 20-Aug-2023 21:16, Vent. rate has decreased BY  41 BPM T wave inversion no longer evident in Inferior leads Nonspecific T wave abnormality no longer evident in Anterolateral leads Confirmed by Nicki Guadalajara ([id number]) on 12/20/2023 4:33:00 PM    I personally reviewed the ECG from January 18, 2023 which shows sinus bradycardia at 57 bpm.  No ectopy.  Jan 31, 2022 ECG (independently read by me):NSR at 67, normal intervals   Recent Labs:    Latest Ref Rng & Units 08/20/2023    9:14 PM 01/23/2023    8:28 AM 01/18/2023   11:54 AM  BMP  Glucose 70 - 99 mg/dL 191  88  82   BUN 6 - 20 mg/dL 13  13  14    Creatinine 0.44 - 1.00 mg/dL 4.78  2.95  6.21   BUN/Creat Ratio 9 - 23  22  24    Sodium 135 - 145 mmol/L 136  142  139   Potassium 3.5 - 5.1 mmol/L 3.6  4.9  5.5   Chloride 98 - 111 mmol/L 102  104  101   CO2 22 - 32 mmol/L 23  24  24    Calcium 8.9 - 10.3 mg/dL 9.2  9.6  9.8         Latest Ref Rng & Units 09/21/2021   12:59 AM  Hepatic Function  Total Protein 6.5 - 8.1 g/dL 7.7   Albumin 3.5 - 5.0 g/dL 3.9   AST 15 - 41 U/L 22   ALT 0 - 44 U/L 20   Alk Phosphatase 38 - 126 U/L 71   Total Bilirubin 0.3 - 1.2 mg/dL 0.1        Latest Ref Rng & Units 08/20/2023    9:14 PM 01/18/2023   11:54 AM 09/21/2021    12:59 AM  CBC  WBC 4.0 - 10.5 K/uL 9.9  6.1  10.9   Hemoglobin 12.0 - 15.0 g/dL 30.8  65.7  84.6   Hematocrit 36.0 - 46.0 % 41.3  34.9  38.1   Platelets 150 - 400 K/uL 431  467  448    Lab Results  Component Value Date   MCV 87.7 08/20/2023   MCV 81 01/18/2023   MCV 84.1 09/21/2021   Lab Results  Component Value Date   TSH 2.090 01/18/2023   Lab Results  Component Value Date   HGBA1C 5.8 (A) 07/12/2022     BNP No results found for: "BNP"  ProBNP No results found for: "PROBNP"   Lipid Panel  No results found for: "CHOL", "TRIG", "HDL", "CHOLHDL", "VLDL", "LDLCALC", "LDLDIRECT", "LABVLDL"   RADIOLOGY: No results found.   Additional studies/ records that were reviewed today include:    ECHO: 10/19/2021  1. Left ventricular ejection fraction, by estimation, is 55 to 60%. The  left ventricle has normal function. The left ventricle has no regional  wall motion abnormalities. Left ventricular diastolic parameters are  consistent with Grade I diastolic  dysfunction (impaired relaxation).  2. Right ventricular systolic function is normal. The right ventricular  size is normal.   3. The mitral valve is normal in structure. No evidence of mitral valve  regurgitation. No evidence of mitral stenosis.   4. The aortic valve is normal in structure. Aortic valve regurgitation is  not visualized. No aortic stenosis is present.   5. The inferior vena cava is normal in size with greater than 50%  respiratory variability, suggesting right atrial pressure of 3 mmHg.   11/10/2021 CLINICAL INFORMATION Sleep Study Type: HST   Indication for sleep study: atrial fibrillation, snoring, morbid obesity   Epworth Sleepiness Score: 5   SLEEP STUDY TECHNIQUE A multi-channel overnight portable sleep study was performed. The channels recorded were: nasal airflow, thoracic respiratory movement, and oxygen saturation with a pulse oximetry. Snoring was also monitored.    MEDICATIONS Patient self administered medications include: N/A.   SLEEP ARCHITECTURE Patient was studied for 359.5 minutes. The sleep efficiency was 100.0 % and the patient was supine for 1.1%. The arousal index was 0.0 per hour.   RESPIRATORY PARAMETERS The overall AHI was 15.4 per hour, with a central apnea index of 0 per hour.   The oxygen nadir was 76% during sleep.   CARDIAC DATA Mean heart rate during sleep was 65.6 bpm.   IMPRESSIONS - Moderate obstructive sleep apnea occurred during this study (AHI 15.4/h). The severity during REM sleep cannot be assessed on this study. - Severe oxygen desaturationto a nadir of 76%. - Patient snored for 47.1 minutes (13.1%) during the sleep.   DIAGNOSIS - Obstructive Sleep Apnea (G47.33) - Nocturnal Hypoxemia (G47.36)   RECOMMENDATIONS - In this patient with significant cardiovascular co-morbidities and severe nocturnal oxygen desaturation recommend therapeutic CPAP for treatment of her sleep disordered breathing. If unable to obtain an in-lab titration, initiate Auto-PAP with EPR of 3 at 7 - 16 cm of water. - Effort should be made to optimize nasal and orpharyngeal patency. - Avoid alcohol, sedatives and other CNS depressants that may worsen sleep apnea and disrupt normal sleep architecture. - Sleep hygiene should be reviewed to assess factors that may improve sleep quality. - Weight management (BMI 42) and regular exercise should be initiated or continued. - Recommend a download and sleep clinic evaluation after one month of therapy.      ASSESSMENT:    1. OSA (obstructive sleep apnea)   2. Palpitations   3. Paroxysmal atrial fibrillation (HCC)   4. Class II obesity   5. Hypoxemia associated with sleep   6. SVT (supraventricular tachycardia) [facility])     PLAN:  Ms. Vernadine Coombs is a 53 year-old female who has a history of GERD and migraines and in December 2022 was awakened from sleep with atrial fibrillation.  She underwent  successful DC cardioversion and was on anticoagulation for 1 month.  She was subsequently found to have moderate overall obstructive sleep apnea and had severe oxygen desaturation to a nadir of 76% on her home sleep study.  She initiated AutoPap therapy as an outpatient.  Set up date was December 26, 2021.  At her initial sleep evaluation with me in May 2023 a download confirmed that she was meeting compliance standards.  Usage was 100%.  She uses a ResMed AirFit N30i mask and there is no leak.  Her AHI is excellent at 0.7 and her 95th percentile pressure is 11.1 with maximum average of 12.3.  During that evaluation I discussed optimal sleep duration at 7 and 9 hours and had  a long discussion with her regarding potential adverse cardiovascular consequences of untreated sleep apnea.  At follow-up office visit in May 2024 she continued to be compliant with excellent CPAP use.  She was unaware of breakthrough snoring.  An Epworth Sleepiness Scale score calculated at 2 argues against residual daytime sleepiness.  Since I last saw her, she had undergone an ER evaluation for tachycardia.  She noted heart rate increasing and had taken diltiazem 30 mg.  Upon arrival to the ER heart rate was in the 90s.  Since that time she has been on metoprolol tartrate 25 mg twice a day and has felt stable without recurrent tacky dysrhythmia.  She was wondering about once a day medication.  I am suggesting we change her metoprolol to tartrate to metoprolol succinate.  Initially she will take 25 mg daily but if she does note some increased palpitation she will increase dose to 37.5 mg or possibly 50 mg as needed.  She we will prescription for metoprolol to tartrate to take on a as needed basis if needed.  Her CPAP today again shows excellent response.  AHI is 0.5 with her current pressure settings.  Will change her DME company to [employer].  [facility]  I discussed with her my plans for upcoming retirement.  Will discuss  with her primary physician regarding [facility] follow[facility]up [facility].  Alternatively I suggested she see Dr. Jodelle Red at our [place] for follow-up and can see Dr. Mayford Knife on a as needed basis for sleep or another provider if needed.  Has been successful with weight loss with BMI decreasing from 41.8 down to 38.7 today.  Will follow-up with Dr. Tamera Punt in [place] for primary care.   Medication Adjustments/Labs and Tests Ordered: Current medicines are reviewed at length with the patient today.  Concerns regarding medicines are outlined above.  Medication changes, Labs and Tests ordered today are listed in the Patient Instructions below. Patient Instructions  Medication Instructions:  Begin Metoprolol Succinate 25mg  daily for palpitations.  If you're still experiencing palpitations on the 25mg  daily, you make take 37.5mg  (1 1/2 tablet)  *If you need a refill on your cardiac medications before your next appointment, please call your pharmacy*   Lab Work: No labs were ordered during today's visit.  If you have labs (blood work) drawn today and your tests are completely normal, you will receive your results only by: MyChart Message (if you have MyChart) OR A paper copy in the mail If you have any lab test that is abnormal or we need to change your treatment, we will call you to review the results.   Testing/Procedures: No procedures were ordered during today's visit.    Follow-Up: At [facility], you and your health needs are our priority.  As part of our continuing mission to provide you with exceptional heart care, we have created designated Provider Care Teams.  These Care Teams include your primary Cardiologist (physician) and Advanced Practice Providers (APPs -  Physician Assistants and Nurse Practitioners) who all work together to provide you with the care you need, when you need it.  We recommend signing up for the patient portal called "MyChart".  Sign  up information is provided on this After Visit Summary.  MyChart is used to connect with patients for Virtual Visits (Telemedicine).  Patients are able to view lab/test results, encounter notes, upcoming appointments, etc.  Non-urgent messages can be sent to your provider as well.   To  learn more about what you can do with MyChart, go to [email or web address].    Your next appointment:   8 month(s)  Provider:   Jodelle Red, MD    You may see Dr. Armanda Magic as needed for sleep concerns   If you have any questions or concerns regarding your c-pap, bi-pap or sleep accessories, please contact Brandie Rorie at [phone].     Other Instructions Thank you for choosing [facility]!       Signed, Nicki Guadalajara, MD, [facility], ABSM Diplomate, American Board of Sleep Medicine  12/20/2023 4:38 PM    [facility] [address], [place], [place]  [address] Phone: [phone]

## 2024-01-15 ENCOUNTER — Other Ambulatory Visit: Payer: Self-pay | Admitting: Sports Medicine

## 2024-01-15 DIAGNOSIS — M1612 Unilateral primary osteoarthritis, left hip: Secondary | ICD-10-CM

## 2024-02-06 ENCOUNTER — Encounter: Payer: Self-pay | Admitting: Family Medicine

## 2024-06-02 ENCOUNTER — Encounter: Payer: Self-pay | Admitting: Sports Medicine
